# Patient Record
Sex: Female | Born: 1960 | Race: Black or African American | Hispanic: No | Marital: Single | Smoking: Never smoker
Health system: Southern US, Community
[De-identification: ages and names within clinical notes are randomized; demographics above are authoritative.]

## PROBLEM LIST (undated history)

## (undated) DIAGNOSIS — Z780 Asymptomatic menopausal state: Secondary | ICD-10-CM

## (undated) DIAGNOSIS — F419 Anxiety disorder, unspecified: Secondary | ICD-10-CM

## (undated) DIAGNOSIS — R0600 Dyspnea, unspecified: Secondary | ICD-10-CM

## (undated) DIAGNOSIS — R739 Hyperglycemia, unspecified: Secondary | ICD-10-CM

## (undated) DIAGNOSIS — I480 Paroxysmal atrial fibrillation: Secondary | ICD-10-CM

## (undated) DIAGNOSIS — G43909 Migraine, unspecified, not intractable, without status migrainosus: Secondary | ICD-10-CM

## (undated) DIAGNOSIS — R748 Abnormal levels of other serum enzymes: Secondary | ICD-10-CM

## (undated) DIAGNOSIS — F32A Depression, unspecified: Secondary | ICD-10-CM

## (undated) DIAGNOSIS — I10 Essential (primary) hypertension: Secondary | ICD-10-CM

## (undated) DIAGNOSIS — M1990 Unspecified osteoarthritis, unspecified site: Secondary | ICD-10-CM

## (undated) DIAGNOSIS — J309 Allergic rhinitis, unspecified: Secondary | ICD-10-CM

## (undated) HISTORY — PX: ABDOMINAL HYSTERECTOMY: SHX81

## (undated) HISTORY — DX: Asymptomatic menopausal state: Z78.0

## (undated) HISTORY — DX: Paroxysmal atrial fibrillation: I48.0

## (undated) HISTORY — DX: Essential (primary) hypertension: I10

## (undated) HISTORY — DX: Allergic rhinitis, unspecified: J30.9

## (undated) HISTORY — DX: Hyperglycemia, unspecified: R73.9

## (undated) HISTORY — DX: Migraine, unspecified, not intractable, without status migrainosus: G43.909

## (undated) HISTORY — DX: Dyspnea, unspecified: R06.00

## (undated) HISTORY — PX: NO PAST SURGERIES: SHX2092

## (undated) HISTORY — DX: Unspecified osteoarthritis, unspecified site: M19.90

## (undated) HISTORY — DX: Abnormal levels of other serum enzymes: R74.8

## (undated) HISTORY — DX: Anxiety disorder, unspecified: F41.9

## (undated) HISTORY — DX: Depression, unspecified: F32.A

---

## 2007-12-26 ENCOUNTER — Ambulatory Visit (HOSPITAL_COMMUNITY): Admission: RE | Admit: 2007-12-26 | Discharge: 2007-12-26 | Payer: Self-pay | Admitting: Internal Medicine

## 2020-07-13 ENCOUNTER — Encounter: Payer: Self-pay | Admitting: Cardiology

## 2020-07-13 DIAGNOSIS — I10 Essential (primary) hypertension: Secondary | ICD-10-CM | POA: Insufficient documentation

## 2020-07-13 DIAGNOSIS — R748 Abnormal levels of other serum enzymes: Secondary | ICD-10-CM

## 2020-07-13 DIAGNOSIS — F419 Anxiety disorder, unspecified: Secondary | ICD-10-CM | POA: Insufficient documentation

## 2020-07-13 DIAGNOSIS — M1990 Unspecified osteoarthritis, unspecified site: Secondary | ICD-10-CM | POA: Insufficient documentation

## 2020-07-13 DIAGNOSIS — F32A Depression, unspecified: Secondary | ICD-10-CM | POA: Insufficient documentation

## 2020-07-13 DIAGNOSIS — I480 Paroxysmal atrial fibrillation: Secondary | ICD-10-CM

## 2020-07-30 ENCOUNTER — Encounter: Payer: Self-pay | Admitting: Cardiology

## 2020-07-30 ENCOUNTER — Telehealth: Payer: Self-pay | Admitting: Cardiology

## 2020-07-30 ENCOUNTER — Ambulatory Visit (INDEPENDENT_AMBULATORY_CARE_PROVIDER_SITE_OTHER): Payer: Self-pay | Admitting: Cardiology

## 2020-07-30 ENCOUNTER — Other Ambulatory Visit: Payer: Self-pay

## 2020-07-30 VITALS — BP 150/98 | HR 73 | Ht 65.0 in | Wt 160.4 lb

## 2020-07-30 DIAGNOSIS — R0609 Other forms of dyspnea: Secondary | ICD-10-CM | POA: Insufficient documentation

## 2020-07-30 DIAGNOSIS — R0600 Dyspnea, unspecified: Secondary | ICD-10-CM

## 2020-07-30 DIAGNOSIS — I209 Angina pectoris, unspecified: Secondary | ICD-10-CM

## 2020-07-30 DIAGNOSIS — I10 Essential (primary) hypertension: Secondary | ICD-10-CM

## 2020-07-30 DIAGNOSIS — I480 Paroxysmal atrial fibrillation: Secondary | ICD-10-CM

## 2020-07-30 DIAGNOSIS — R748 Abnormal levels of other serum enzymes: Secondary | ICD-10-CM

## 2020-07-30 HISTORY — DX: Other forms of dyspnea: R06.09

## 2020-07-30 HISTORY — DX: Angina pectoris, unspecified: I20.9

## 2020-07-30 HISTORY — DX: Dyspnea, unspecified: R06.00

## 2020-07-30 MED ORDER — NITROGLYCERIN 0.4 MG SL SUBL: 0.4000 mg | SUBLINGUAL_TABLET | SUBLINGUAL | 6 refills | Status: DC | PRN

## 2020-07-30 NOTE — Progress Notes (Signed)
Cardiology Office Note:    Date:  07/30/2020   ID:  Joanna Griffin, DOB 1961/02/10, MRN [id number]  PCP:  Simone Curia, MD  Cardiologist:  Garwin Brothers, MD   Referring MD: Simone Curia, MD    ASSESSMENT:    1. Paroxysmal atrial fibrillation (HCC)   2. Angina pectoris (HCC)   3. DOE (dyspnea on exertion)   4. Abnormal liver enzymes   5. Primary hypertension    PLAN:    In order of problems listed above:  1. Angina pectoris: Chest tightness on exertion and dyspnea on exertion: This patient comes in standing and I discussed this with the patient at length.  In view of the following I will send her to [facility] now for blood work including D-dimer.  I am concerned whether there is any possibility of pulmonary embolism.  If this test is negative I will send her for coronary angiography.  I discussed invasive and noninvasive evaluation including CT coronary angiography with FFR.  She is not keen on any of those.  She wants coronary angiography because of the significant nature of her symptoms.  I respect her wishes.I discussed coronary angiography and left heart catheterization with the patient at extensive length. Procedure, benefits and potential risks were explained. Patient had multiple questions which were answered to the patient's satisfaction. Patient agreed and consented for the procedure. Further recommendations will be made based on the findings of the coronary angiography. In the interim. The patient has any significant symptoms he knows to go to the nearest emergency room.  She was also advised to take a coated baby aspirin on a daily basis 81 mg.  Sublingual nitroglycerin prescription was sent, its protocol and 911 protocol explained and the patient vocalized understanding questions were answered to the patient's satisfaction 2. Essential hypertension: Blood pressure stable and diet was emphasized. 3. History of paroxysmal atrial fibrillation: This is provided by the patient.   Medical management at this time. 4. Elevated LFTs: Managed by primary care provider. 5. Patient will be seen in follow-up appointment in 6 weeks or earlier if the patient has any concerns    Medication Adjustments/Labs and Tests Ordered: Current medicines are reviewed at length with the patient today.  Concerns regarding medicines are outlined above.  Orders Placed This Encounter  Procedures  . EKG 12-Lead   No orders of the defined types were placed in this encounter.    History of Present Illness:    Joanna Griffin is a 60 y.o. female who is being seen today for the evaluation of dyspnea on exertion and chest tightness at the request of Simone Curia, MD.  Patient is a pleasant 60 year old female.  She has past medical history of essential hypertension and paroxysmal atrial fibrillation.  She mentions to me that she was told to have atrial fibrillation but occipital in nature and [facility] many years ago.  Patient is referred here for shortness of breath on exertion.  She tells me that she walks a few steps and get out of breath.  This has been of concern to her.  This is been happening for the past several weeks.  No orthopnea or PND.  She does not exercise on a regular basis.  She is not sexually active.  She also has chest tightness with the symptoms.  At the time of my evaluation, the patient is alert awake oriented and in no distress.  Past Medical History:  Diagnosis Date  . Abnormal liver enzymes   .  Allergic rhinitis   . Anxiety   . Depression   . Dyspnea   . Hyperglycemia   . Hypertension, benign   . Menopause   . Migraine   . Osteoarthritis   . Paroxysmal atrial fibrillation [facility])     Past Surgical History:  Procedure Laterality Date  . NO PAST SURGERIES      Current Medications: Current Meds  Medication Sig  . ALPRAZolam (XANAX) 0.25 MG tablet Take 0.25 mg by mouth every 8 (eight) hours as needed for anxiety.  Marland Kitchen aspirin EC 81 MG tablet Take 81 mg by mouth  daily. Swallow whole.  . estradiol (ESTRACE) 2 MG tablet Take 2 mg by mouth daily.  Marland Kitchen ibuprofen (ADVIL) 200 MG tablet Take 200 mg by mouth daily as needed for mild pain.  Marland Kitchen loratadine (CLARITIN) 10 MG tablet Take 10 mg by mouth daily.  . meloxicam (MOBIC) 15 MG tablet Take 15 mg by mouth daily as needed for pain.  . metoprolol succinate (TOPROL-XL) 50 MG 24 hr tablet Take 50 mg by mouth daily. Take with or immediately following a meal.  . SUMAtriptan (IMITREX) 100 MG tablet Take 100 mg by mouth every 2 (two) hours as needed for migraine. May repeat in 2 hours if headache persists or recurs.     Allergies:   Codeine   Social History   Socioeconomic History  . Marital status: Single    Spouse name: Not on file  . Number of children: Not on file  . Years of education: Not on file  . Highest education level: Not on file  Occupational History  . Not on file  Tobacco Use  . Smoking status: Never Smoker  . Smokeless tobacco: Never Used  Substance and Sexual Activity  . Alcohol use: Never  . Drug use: Never  . Sexual activity: Not on file  Other Topics Concern  . Not on file  Social History Narrative  . Not on file   Social Determinants of Health   Financial Resource Strain: Not on file  Food Insecurity: Not on file  Transportation Needs: Not on file  Physical Activity: Not on file  Stress: Not on file  Social Connections: Not on file     Family History: The patient's family history includes Hypertension in her father and mother.  ROS:   Please see the history of present illness.    All other systems reviewed and are negative.  EKGs/Labs/Other Studies Reviewed:    The following studies were reviewed today: EKG reveals sinus rhythm and nonspecific ST-T changes.   Recent Labs: No results found for requested labs within last 8760 hours.  Recent Lipid Panel No results found for: CHOL, TRIG, HDL, CHOLHDL, VLDL, LDLCALC, LDLDIRECT  Physical Exam:    VS:  BP (!) 150/98    Pulse 73   Ht 5\' 5"  (1.651 m)   Wt 160 lb 6.4 oz (72.8 kg)   SpO2 99%   BMI 26.69 kg/m     Wt Readings from Last 3 Encounters:  07/30/20 160 lb 6.4 oz (72.8 kg)  07/07/20 155 lb (70.3 kg)     GEN: Patient is in no acute distress HEENT: Normal NECK: No JVD; No carotid bruits LYMPHATICS: No lymphadenopathy CARDIAC: S1 S2 regular, 2/6 systolic murmur at the apex. RESPIRATORY:  Clear to auscultation without rales, wheezing or rhonchi  ABDOMEN: Soft, non-tender, non-distended MUSCULOSKELETAL:  No edema; No deformity  SKIN: Warm and dry NEUROLOGIC:  Alert and oriented x 3 PSYCHIATRIC:  Normal  affect    Signed, Garwin Brothers, MD  07/30/2020 2:09 PM    [facility]

## 2020-07-30 NOTE — Patient Instructions (Signed)
Medication Instructions:  Your physician has recommended you make the following change in your medication:   Take 81 mg coated aspirin daily. Use nitroglycerin as needed for chest pain.  *If you need a refill on your cardiac medications before your next appointment, please call your pharmacy*   Lab Work: Your physician recommends that you have a BMET and CBC at [facility].  If you have labs (blood work) drawn today and your tests are completely normal, you will receive your results only by: Marland Kitchen MyChart Message (if you have MyChart) OR . A paper copy in the mail If you have any lab test that is abnormal or we need to change your treatment, we will call you to review the results.   Testing/Procedures:    [facility] CARDIOVASCULAR DIVISION [facility] [place] [address] [place] [address] Dept: [phone] Loc: [phone]  SONTEE DESENA  07/30/2020  You are scheduled for a Cardiac Catheterization on Monday, April 11 with Dr. Nicki Guadalajara.  1. Please arrive at the [facility] (Main Entrance A) at [facility]: [address] [place], [place] [address] at 7:00 AM (This time is two hours before your procedure to ensure your preparation). Free valet parking service is available.   Special note: Every effort is made to have your procedure done on time. Please understand that emergencies sometimes delay scheduled procedures.  2. Diet: Do not eat solid foods after midnight.  The patient may have clear liquids until 5am upon the day of the procedure.  3. Labs: Your labs were done at [facility].  4. Medication instructions in preparation for your procedure:   Contrast Allergy: No  Stop taking, Advil or Motrin (Ibuprofen), Mobic (Meloxicam) Monday, April 10,  On the morning of your procedure, take your Aspirin and any morning medicines NOT listed above.  You may use sips of water.  5. Plan for one night stay--bring  personal belongings. 6. Bring a current list of your medications and current insurance cards. 7. You MUST have a responsible person to drive you home. 8. Someone MUST be with you the first 24 hours after you arrive home or your discharge will be delayed. 9. Please wear clothes that are easy to get on and off and wear slip-on shoes.  Thank you for allowing [place] to care for you!   -- [facility]  Your physician has requested that you have an echocardiogram. Echocardiography is a painless test that uses sound waves to create images of your heart. It provides your doctor with information about the size and shape of your heart and how well your heart's chambers and valves are working. This procedure takes approximately one hour. There are no restrictions for this procedure.    Follow-Up: At [facility], you and your health needs are our priority.  As part of our continuing mission to provide you with exceptional heart care, we have created designated Provider Care Teams.  These Care Teams include your primary Cardiologist (physician) and Advanced Practice Providers (APPs -  Physician Assistants and Nurse Practitioners) who all work together to provide you with the care you need, when you need it.  We recommend signing up for the patient portal called "MyChart".  Sign up information is provided on this After Visit Summary.  MyChart is used to connect with patients for Virtual Visits (Telemedicine).  Patients are able to view lab/test results, encounter notes, upcoming appointments, etc.  Non-urgent messages can  be sent to your provider as well.   To learn more about what you can do with MyChart, go to [email or web address].    Your next appointment:   1 month(s)  The format for your next appointment:   In Person  Provider:   Belva Cromeajan Revankar, MD   Other Instructions  Coronary Angiogram With Stent Coronary angiogram with stent placement is a procedure to  widen or open a narrow blood vessel of the heart (coronary artery). Arteries may become blocked by cholesterol buildup (plaques) in the lining of the artery wall. When a coronary artery becomes partially blocked, blood flow to that area decreases. This may lead to chest pain or a heart attack (myocardial infarction). A stent is a small piece of metal that looks like mesh or spring. Stent placement may be done as treatment after a heart attack, or to prevent a heart attack if a blocked artery is found by a coronary angiogram. Let your health care provider know about:  Any allergies you have, including allergies to medicines or contrast dye.  All medicines you are taking, including vitamins, herbs, eye drops, creams, and over-the-counter medicines.  Any problems you or family members have had with anesthetic medicines.  Any blood disorders you have.  Any surgeries you have had.  Any medical conditions you have, including kidney problems or kidney failure.  Whether you are pregnant or may be pregnant.  Whether you are breastfeeding. What are the risks? Generally, this is a safe procedure. However, serious problems may occur, including: 1. Damage to nearby structures or organs, such as the heart, blood vessels, or kidneys. 2. A return of blockage. 3. Bleeding, infection, or bruising at the insertion site. 4. A collection of blood under the skin (hematoma) at the insertion site. 5. A blood clot in another part of the body. 6. Allergic reaction to medicines or dyes. 7. Bleeding into the abdomen (retroperitoneal bleeding). 8. Stroke (rare). 9. Heart attack (rare). What happens before the procedure? Staying hydrated Follow instructions from your health care provider about hydration, which may include: 1. Up to 2 hours before the procedure - you may continue to drink clear liquids, such as water, clear fruit juice, black coffee, and plain tea.    Eating and drinking restrictions Follow  instructions from your health care provider about eating and drinking, which may include: 1. 8 hours before the procedure - stop eating heavy meals or foods, such as meat, fried foods, or fatty foods. 2. 6 hours before the procedure - stop eating light meals or foods, such as toast or cereal. 3. 2 hours before the procedure - stop drinking clear liquids. Medicines Ask your health care provider about: 1. Changing or stopping your regular medicines. This is especially important if you are taking diabetes medicines or blood thinners. 2. Taking medicines such as aspirin and ibuprofen. These medicines can thin your blood. Do not take these medicines unless your health care provider tells you to take them. ? Generally, aspirin is recommended before a thin tube, called a catheter, is passed through a blood vessel and inserted into the heart (cardiac catheterization). 3. Taking over-the-counter medicines, vitamins, herbs, and supplements. General instructions 1. Do not use any products that contain nicotine or tobacco for at least 4 weeks before the procedure. These products include cigarettes, e-cigarettes, and chewing tobacco. If you need help quitting, ask your health care provider. 2. Plan to have someone take you home from the hospital or clinic. 3. If you will be going  home right after the procedure, plan to have someone with you for 24 hours. 4. You may have tests and imaging procedures. 5. Ask your health care provider: 1. How your insertion site will be marked. Ask which artery will be used for the procedure. 2. What steps will be taken to help prevent infection. These may include:  Removing hair at the insertion site.  Washing skin with a germ-killing soap.  Taking antibiotic medicine. What happens during the procedure? 1. An IV will be inserted into one of your veins. 2. Electrodes may be placed on your chest to monitor your heart rate during the procedure. 3. You will be given one or  more of the following: ? A medicine to help you relax (sedative). ? A medicine to numb the area (local anesthetic) for catheter insertion. 4. A small incision will be made for catheter insertion. 5. The catheter will be inserted into an artery using a guide wire. The location may be in your groin, your wrist, or the fold of your arm (near your elbow). 6. An X-ray procedure (fluoroscopy) will be used to help guide the catheter to the opening of the heart arteries. 7. A dye will be injected into the catheter. X-rays will be taken. The dye helps to show where any narrowing or blockages are located in the arteries. 8. Tell your health care provider if you have chest pain or trouble breathing. 9. A tiny wire will be guided to the blocked spot, and a balloon will be inflated to make the artery wider. 10. The stent will be expanded to crush the plaques into the wall of the vessel. The stent will hold the area open and improve the blood flow. Most stents have a drug coating to reduce the risk of the stent narrowing over time. 11. The artery may be made wider using a drill, laser, or other tools that remove plaques. 12. The catheter will be removed when the blood flow improves. The stent will stay where it was placed, and the lining of the artery will grow over it. 13. A bandage (dressing) will be placed on the insertion site. Pressure will be applied to stop bleeding. 14. The IV will be removed. This procedure may vary among health care providers and hospitals.    What happens after the procedure?  Your blood pressure, heart rate, breathing rate, and blood oxygen level will be monitored until you leave the hospital or clinic.  If the procedure is done through the leg, you will lie flat in bed for a few hours or for as long as told by your health care provider. You will be instructed not to bend or cross your legs.  The insertion site and the pulse in your foot or wrist will be checked often.  You may  have more blood tests, X-rays, and a test that records the electrical activity of your heart (electrocardiogram, or ECG).  Do not drive for 24 hours if you were given a sedative during your procedure. Summary  Coronary angiogram with stent placement is a procedure to widen or open a narrowed coronary artery. This is done to treat heart problems.  Before the procedure, let your health care provider know about all the medical conditions and surgeries you have or have had.  This is a safe procedure. However, some problems may occur, including damage to nearby structures or organs, bleeding, blood clots, or allergies.  Follow your health care provider's instructions about eating, drinking, medicines, and other lifestyle changes,  such as quitting tobacco use before the procedure. This information is not intended to replace advice given to you by your health care provider. Make sure you discuss any questions you have with your health care provider. Document Revised: 10/31/2018 Document Reviewed: 10/31/2018 Elsevier Patient Education  2021 Elsevier Inc.  Aspirin and Your Heart Aspirin is a medicine that prevents the platelets in your blood from sticking together. Platelets are the cells that your blood uses for clotting. Aspirin can be used to help reduce the risk of blood clots, heart attacks, and other heart-related problems. What are the risks? Daily use of aspirin can cause side effects. Some of these include:  Bleeding. Bleeding can be minor or serious. An example of minor bleeding is bleeding from a cut, and the bleeding does not stop. An example of more serious bleeding is stomach bleeding or, rarely, bleeding into the brain. Your risk of bleeding increases if you are also taking NSAIDs, such as ibuprofen.  Increased bruising.  Upset stomach.  An allergic reaction. People who have growths inside the nose (nasal polyps) have an increased risk of developing an aspirin allergy. How to use  aspirin to care for your heart 10. Take aspirin only as told by your health care provider. Make sure that you understand how much to take and what form to take. The two forms of aspirin are: 1. Non-enteric-coated.This type of aspirin does not have a coating and is absorbed quickly. This type of aspirin also comes in a chewable form. 2. Enteric-coated. This type of aspirin has a coating that releases the medicine very slowly. Enteric-coated aspirin might cause less stomach upset than non-enteric-coated aspirin. This type of aspirin should not be chewed or crushed. 11. Work with your health care provider to find out whether it is safe and beneficial for you to take aspirin daily. Taking aspirin daily may be helpful if: 1. You have had a heart attack or chest pain, or you are at risk for a heart attack. 2. You have a condition in which certain heart vessels are blocked (coronary artery disease), and you have had a procedure to treat it. Examples are:  Open-heart surgery, such as coronary artery bypass surgery (CABG).  Coronary angioplasty,which is done to widen a blood vessel of your heart.  Having a small mesh tube, or stent, placed in your coronary artery. 3. You have had certain types of stroke or a mini-stroke known as a transient ischemic attack (TIA). 4. You have a narrowing of the arteries that supply the limbs (peripheral artery disease, or PAD). 5. You have long-term (chronic) heart rhythm problems, such as atrial fibrillation, and your health care provider thinks aspirin may help. 6. You have valve disease or have had surgery on a valve. 7. You are considered at increased risk of developing coronary artery disease or PAD.    Follow these instructions at home Medicines 2. Take over-the-counter and prescription medicines only as told by your health care provider. 3. If you are taking blood thinners: ? Talk with your health care provider before you take any medicines that contain aspirin  or NSAIDs, such as ibuprofen. These medicines increase your risk for dangerous bleeding. ? Take your medicine exactly as told, at the same time every day. ? Avoid activities that could cause injury or bruising, and follow instructions about how to prevent falls. ? Wear a medical alert bracelet or carry a card that lists what medicines you take. General instructions 4. Do not drink alcohol if: 1.  Your health care provider tells you not to drink. 2. You are pregnant, may be pregnant, or are planning to become pregnant. 5. If you drink alcohol: 1. Limit how much you use to:  0-1 drink a day for women.  0-2 drinks a day for men. 2. Be aware of how much alcohol is in your drink. In the [place] one drink equals one 12 oz bottle of beer (355 mL), one 5 oz glass of wine (148 mL), or one 1 oz glass of hard liquor (44 mL). 6. Keep all follow-up visits as told by your health care provider. This is important. Where to find more information 4. The American Heart Association: www.heart.org Contact a health care provider if you have: 6. Unusual bleeding or bruising. 7. Stomach pain or nausea. 8. Ringing in your ears. 9. An allergic reaction that causes hives, itchy skin, or swelling of the lips, tongue, or face. Get help right away if: 15. You notice that your bowel movements are bloody, or dark red or black in color. 16. You vomit or cough up blood. 17. You have blood in your urine. 18. You cough, breathe loudly (wheeze), or feel short of breath. 19. You have chest pain, especially if the pain spreads to your arms, back, neck, or jaw. 20. You have a headache with confusion. You have any symptoms of a stroke. "BE FAST" is an easy way to remember the main warning signs of a stroke:  B - Balance. Signs are dizziness, sudden trouble walking, or loss of balance.  E - Eyes. Signs are trouble seeing or a sudden change in vision.  F - Face. Signs are sudden weakness or numbness of the face, or the face  or eyelid drooping on one side.  A - Arms. Signs are weakness or numbness in an arm. This happens suddenly and usually on one side of the body.  S - Speech. Signs are sudden trouble speaking, slurred speech, or trouble understanding what people say.  T - Time. Time to call emergency services. Write down what time symptoms started. You have other signs of a stroke, such as:  A sudden, severe headache with no known cause.  Nausea or vomiting.  Seizure. These symptoms may represent a serious problem that is an emergency. Do not wait to see if the symptoms will go away. Get medical help right away. Call your local emergency services (911 in the [place] Do not drive yourself to the hospital. Summary  Aspirin use can help reduce the risk of blood clots, heart attacks, and other heart-related problems.  Daily use of aspirin can cause side effects.  Take aspirin only as told by your health care provider. Make sure that you understand how much to take and what form to take.  Your health care provider will help you determine whether it is safe and beneficial for you to take aspirin daily. This information is not intended to replace advice given to you by your health care provider. Make sure you discuss any questions you have with your health care provider. Document Revised: 01/14/2019 Document Reviewed: 01/14/2019 Elsevier Patient Education  2021 Elsevier Inc. Nitroglycerin sublingual tablets What is this medicine? NITROGLYCERIN (nye troe GLI ser in) is a type of vasodilator. It relaxes blood vessels, increasing the blood and oxygen supply to your heart. This medicine is used to relieve chest pain caused by angina. It is also used to prevent chest pain before activities like climbing stairs, going outdoors in cold weather, or sexual activity.  This medicine may be used for other purposes; ask your health care provider or pharmacist if you have questions. COMMON BRAND NAME(S): Nitroquick,  Nitrostat, Nitrotab What should I tell my health care provider before I take this medicine? They need to know if you have any of these conditions:  anemia  head injury, recent stroke, or bleeding in the brain  liver disease  previous heart attack  an unusual or allergic reaction to nitroglycerin, other medicines, foods, dyes, or preservatives  pregnant or trying to get pregnant  breast-feeding How should I use this medicine? Take this medicine by mouth as needed. Use at the first sign of an angina attack (chest pain or tightness). You can also take this medicine 5 to 10 minutes before an event likely to produce chest pain. Follow the directions exactly as written on the prescription label. Place one tablet under your tongue and let it dissolve. Do not swallow whole. Replace the dose if you accidentally swallow it. It will help if your mouth is not dry. Saliva around the tablet will help it to dissolve more quickly. Do not eat or drink, smoke or chew tobacco while a tablet is dissolving. Sit down when taking this medicine. In an angina attack, you should feel better within 5 minutes after your first dose. You can take a dose every 5 minutes up to a total of 3 doses. If you do not feel better or feel worse after 1 dose, call 9-1-1 at once. Do not take more than 3 doses in 15 minutes. Your health care provider might give you other directions. Follow those directions if he or she does. Do not take your medicine more often than directed. Talk to your health care provider about the use of this medicine in children. Special care may be needed. Overdosage: If you think you have taken too much of this medicine contact a poison control center or emergency room at once. NOTE: This medicine is only for you. Do not share this medicine with others. What if I miss a dose? This does not apply. This medicine is only used as needed. What may interact with this medicine? Do not take this medicine with any of  the following medications: 12. certain migraine medicines like ergotamine and dihydroergotamine (DHE) 13. medicines used to treat erectile dysfunction like sildenafil, tadalafil, and vardenafil 14. riociguat This medicine may also interact with the following medications: 4. alteplase 5. aspirin 6. heparin 7. medicines for high blood pressure 8. medicines for mental depression 9. other medicines used to treat angina 10. phenothiazines like chlorpromazine, mesoridazine, prochlorperazine, thioridazine This list may not describe all possible interactions. Give your health care provider a list of all the medicines, herbs, non-prescription drugs, or dietary supplements you use. Also tell them if you smoke, drink alcohol, or use illegal drugs. Some items may interact with your medicine. What should I watch for while using this medicine? Tell your doctor or health care professional if you feel your medicine is no longer working. Keep this medicine with you at all times. Sit or lie down when you take your medicine to prevent falling if you feel dizzy or faint after using it. Try to remain calm. This will help you to feel better faster. If you feel dizzy, take several deep breaths and lie down with your feet propped up, or bend forward with your head resting between your knees. You may get drowsy or dizzy. Do not drive, use machinery, or do anything that needs mental alertness until  you know how this drug affects you. Do not stand or sit up quickly, especially if you are an older patient. This reduces the risk of dizzy or fainting spells. Alcohol can make you more drowsy and dizzy. Avoid alcoholic drinks. Do not treat yourself for coughs, colds, or pain while you are taking this medicine without asking your doctor or health care professional for advice. Some ingredients may increase your blood pressure. What side effects may I notice from receiving this medicine? Side effects that you should report to your  doctor or health care professional as soon as possible: 7. allergic reactions (skin rash, itching or hives; swelling of the face, lips, or tongue) 8. low blood pressure (dizziness; feeling faint or lightheaded, falls; unusually weak or tired) 9. low red blood cell counts (trouble breathing; feeling faint; lightheaded, falls; unusually weak or tired) Side effects that usually do not require medical attention (report to your doctor or health care professional if they continue or are bothersome): 5. facial flushing (redness) 6. headache 7. nausea, vomiting This list may not describe all possible side effects. Call your doctor for medical advice about side effects. You may report side effects to FDA at 1-800-FDA-1088. Where should I keep my medicine? Keep out of the reach of children. Store at room temperature between 20 and 25 degrees C (68 and 77 degrees F). Store in Retail buyer. Protect from light and moisture. Keep tightly closed. Throw away any unused medicine after the expiration date. NOTE: This sheet is a summary. It may not cover all possible information. If you have questions about this medicine, talk to your doctor, pharmacist, or health care provider.  2021 Elsevier/Gold Standard (2018-01-10 16:46:32)   Echocardiogram An echocardiogram is a test that uses sound waves (ultrasound) to produce images of the heart. Images from an echocardiogram can provide important information about:  Heart size and shape.  The size and thickness and movement of your heart's walls.  Heart muscle function and strength.  Heart valve function or if you have stenosis. Stenosis is when the heart valves are too narrow.  If blood is flowing backward through the heart valves (regurgitation).  A tumor or infectious growth around the heart valves.  Areas of heart muscle that are not working well because of poor blood flow or injury from a heart attack.  Aneurysm detection. An aneurysm is a weak  or damaged part of an artery wall. The wall bulges out from the normal force of blood pumping through the body. Tell a health care provider about:  Any allergies you have.  All medicines you are taking, including vitamins, herbs, eye drops, creams, and over-the-counter medicines.  Any blood disorders you have.  Any surgeries you have had.  Any medical conditions you have.  Whether you are pregnant or may be pregnant. What are the risks? Generally, this is a safe test. However, problems may occur, including an allergic reaction to dye (contrast) that may be used during the test. What happens before the test? No specific preparation is needed. You may eat and drink normally. What happens during the test?  You will take off your clothes from the waist up and put on a hospital gown.  Electrodes or electrocardiogram (ECG)patches may be placed on your chest. The electrodes or patches are then connected to a device that monitors your heart rate and rhythm.  You will lie down on a table for an ultrasound exam. A gel will be applied to your chest to help sound waves  pass through your skin.  A handheld device, called a transducer, will be pressed against your chest and moved over your heart. The transducer produces sound waves that travel to your heart and bounce back (or "echo" back) to the transducer. These sound waves will be captured in real-time and changed into images of your heart that can be viewed on a video monitor. The images will be recorded on a computer and reviewed by your health care provider.  You may be asked to change positions or hold your breath for a short time. This makes it easier to get different views or better views of your heart.  In some cases, you may receive contrast through an IV in one of your veins. This can improve the quality of the pictures from your heart. The procedure may vary among health care providers and hospitals.   What can I expect after the  test? You may return to your normal, everyday life, including diet, activities, and medicines, unless your health care provider tells you not to do that. Follow these instructions at home:  It is up to you to get the results of your test. Ask your health care provider, or the department that is doing the test, when your results will be ready.  Keep all follow-up visits. This is important. Summary  An echocardiogram is a test that uses sound waves (ultrasound) to produce images of the heart.  Images from an echocardiogram can provide important information about the size and shape of your heart, heart muscle function, heart valve function, and other possible heart problems.  You do not need to do anything to prepare before this test. You may eat and drink normally.  After the echocardiogram is completed, you may return to your normal, everyday life, unless your health care provider tells you not to do that. This information is not intended to replace advice given to you by your health care provider. Make sure you discuss any questions you have with your health care provider. Document Revised: 12/03/2019 Document Reviewed: 12/03/2019 Elsevier Patient Education  2021 [employer].

## 2020-07-30 NOTE — Telephone Encounter (Signed)
Lisa with [facility] states the patient just showed up at the hospital with an order given to her from DR. Revankar. Misty Stanley states the order is missing the ICD 10 code. Can someone call and provide this to her soon?  I am not able to access it.  Phone #: [phone] (ext#: [phone])

## 2020-07-30 NOTE — Telephone Encounter (Signed)
ICD 10 code provided

## 2020-07-31 ENCOUNTER — Other Ambulatory Visit (HOSPITAL_COMMUNITY)
Admission: RE | Admit: 2020-07-31 | Discharge: 2020-07-31 | Disposition: A | Discharge status: Home / Self Care | Payer: PRIVATE HEALTH INSURANCE | Source: Ambulatory Visit | Attending: Cardiovascular Disease | Admitting: Cardiovascular Disease

## 2020-07-31 ENCOUNTER — Other Ambulatory Visit: Payer: Self-pay

## 2020-07-31 ENCOUNTER — Telehealth: Payer: Self-pay

## 2020-07-31 DIAGNOSIS — Z20822 Contact with and (suspected) exposure to covid-19: Secondary | ICD-10-CM | POA: Insufficient documentation

## 2020-07-31 DIAGNOSIS — Z01812 Encounter for preprocedural laboratory examination: Secondary | ICD-10-CM | POA: Insufficient documentation

## 2020-07-31 DIAGNOSIS — R079 Chest pain, unspecified: Secondary | ICD-10-CM

## 2020-07-31 MED ORDER — FUROSEMIDE 40 MG PO TABS: 40.0000 mg | ORAL_TABLET | Freq: Every day | ORAL | 0 refills | Status: DC

## 2020-07-31 NOTE — Telephone Encounter (Signed)
Patients CTA report from [facility] was reviewed by Dr. Servando Salina. No pulmonary embolism noted. Trace right pleural effusion and mild edema was present. Per Dr. Mallory Shirk verbal instructions I sent in 40 mgs of furosemide for the patient to take for the next two days. Patient verbalizes understanding.    Encouraged patient to call back with any questions or concerns.

## 2020-07-31 NOTE — Addendum Note (Signed)
Addended by: Eleonore Chiquito on: 07/31/2020 10:18 AM   Modules accepted: Orders

## 2020-07-31 NOTE — Telephone Encounter (Signed)
Called pt and advised her that she needed to have a stat chest CT to R/O PE after having a positive D-Dimer. Pt states where do I need to do that, I gave her the option of [facility] or [facility]. Pt then states that I have a lot going on today and don't think I really need it. I advised pt that she could have a blood clot in her lung and without a CT for confirmation and if she has a blood clot in her lung that goes untreated she could die. Pt states well I understand but I have a lot going on today and I might call you back if I decide. I again stated that if she has a blood clot in her lung and it goes untreated she could die. Pt verbalized understanding and had no additional questions.

## 2020-07-31 NOTE — Telephone Encounter (Signed)
Appointment for stat CT 07/31/20 at 2:15. Pt is aware and verbalized understanding.

## 2020-07-31 NOTE — Telephone Encounter (Signed)
I called her and let her know and she said she was waiting for your records to get an appointment or something like that I called her at her home number and she also gave me her cell phone number [phone]

## 2020-08-01 LAB — SARS CORONAVIRUS 2 (TAT 6-24 HRS): SARS Coronavirus 2: NEGATIVE

## 2020-08-03 ENCOUNTER — Encounter (HOSPITAL_COMMUNITY)
Admission: RE | Disposition: A | Discharge status: Home / Self Care | Payer: Self-pay | Source: Ambulatory Visit | Attending: Cardiovascular Disease

## 2020-08-03 ENCOUNTER — Ambulatory Visit (HOSPITAL_COMMUNITY)
Admission: RE | Admit: 2020-08-03 | Discharge: 2020-08-03 | Disposition: A | Discharge status: Home / Self Care | Payer: Self-pay | Source: Ambulatory Visit | Attending: Cardiovascular Disease | Admitting: Cardiovascular Disease

## 2020-08-03 ENCOUNTER — Other Ambulatory Visit: Payer: Self-pay

## 2020-08-03 ENCOUNTER — Encounter (HOSPITAL_COMMUNITY): Payer: Self-pay | Admitting: Cardiovascular Disease

## 2020-08-03 DIAGNOSIS — R079 Chest pain, unspecified: Secondary | ICD-10-CM

## 2020-08-03 DIAGNOSIS — R0600 Dyspnea, unspecified: Secondary | ICD-10-CM

## 2020-08-03 DIAGNOSIS — R748 Abnormal levels of other serum enzymes: Secondary | ICD-10-CM

## 2020-08-03 DIAGNOSIS — I480 Paroxysmal atrial fibrillation: Secondary | ICD-10-CM

## 2020-08-03 DIAGNOSIS — R0609 Other forms of dyspnea: Secondary | ICD-10-CM

## 2020-08-03 DIAGNOSIS — I10 Essential (primary) hypertension: Secondary | ICD-10-CM | POA: Insufficient documentation

## 2020-08-03 DIAGNOSIS — I209 Angina pectoris, unspecified: Secondary | ICD-10-CM

## 2020-08-03 HISTORY — PX: LEFT HEART CATH AND CORONARY ANGIOGRAPHY: CATH118249

## 2020-08-03 SURGERY — LEFT HEART CATH AND CORONARY ANGIOGRAPHY
Anesthesia: LOCAL

## 2020-08-03 MED ORDER — FENTANYL CITRATE (PF) 100 MCG/2ML IJ SOLN
INTRAMUSCULAR | Status: DC | PRN
  Administered 2020-08-03: 50 ug via INTRAVENOUS

## 2020-08-03 MED ORDER — SODIUM CHLORIDE 0.9% FLUSH: 3.0000 mL | Freq: Two times a day (BID) | INTRAVENOUS | Status: DC

## 2020-08-03 MED ORDER — LABETALOL HCL 5 MG/ML IV SOLN: 10.0000 mg | INTRAVENOUS | Status: DC | PRN

## 2020-08-03 MED ORDER — FENTANYL CITRATE (PF) 100 MCG/2ML IJ SOLN
INTRAMUSCULAR | Status: AC
  Filled 2020-08-03: qty 2

## 2020-08-03 MED ORDER — SODIUM CHLORIDE 0.9 % WEIGHT BASED INFUSION: 1.0000 mL/kg/h | INTRAVENOUS | Status: DC

## 2020-08-03 MED ORDER — HYDRALAZINE HCL 20 MG/ML IJ SOLN
INTRAMUSCULAR | Status: DC | PRN
  Administered 2020-08-03: 10 mg via INTRAVENOUS

## 2020-08-03 MED ORDER — HEPARIN SODIUM (PORCINE) 1000 UNIT/ML IJ SOLN
INTRAMUSCULAR | Status: DC | PRN
  Administered 2020-08-03: 3500 [IU] via INTRAVENOUS

## 2020-08-03 MED ORDER — VERAPAMIL HCL 2.5 MG/ML IV SOLN
INTRAVENOUS | Status: AC
  Filled 2020-08-03: qty 2

## 2020-08-03 MED ORDER — HEPARIN (PORCINE) IN NACL 1000-0.9 UT/500ML-% IV SOLN
INTRAVENOUS | Status: AC
  Filled 2020-08-03: qty 500

## 2020-08-03 MED ORDER — ONDANSETRON HCL 4 MG/2ML IJ SOLN: 4.0000 mg | Freq: Four times a day (QID) | INTRAMUSCULAR | Status: DC | PRN

## 2020-08-03 MED ORDER — ACETAMINOPHEN 325 MG PO TABS: 650.0000 mg | ORAL_TABLET | ORAL | Status: DC | PRN

## 2020-08-03 MED ORDER — MIDAZOLAM HCL 2 MG/2ML IJ SOLN
INTRAMUSCULAR | Status: AC
  Filled 2020-08-03: qty 2

## 2020-08-03 MED ORDER — SODIUM CHLORIDE 0.9 % WEIGHT BASED INFUSION
3.0000 mL/kg/h | INTRAVENOUS | Status: AC
  Administered 2020-08-03: 3 mL/kg/h via INTRAVENOUS

## 2020-08-03 MED ORDER — LIDOCAINE HCL (PF) 1 % IJ SOLN
INTRAMUSCULAR | Status: AC
  Filled 2020-08-03: qty 30

## 2020-08-03 MED ORDER — SODIUM CHLORIDE 0.9 % IV SOLN: 250.0000 mL | INTRAVENOUS | Status: DC | PRN

## 2020-08-03 MED ORDER — HYDRALAZINE HCL 20 MG/ML IJ SOLN: 10.0000 mg | INTRAMUSCULAR | Status: DC | PRN

## 2020-08-03 MED ORDER — VERAPAMIL HCL 2.5 MG/ML IV SOLN
INTRAVENOUS | Status: DC | PRN
  Administered 2020-08-03: 10 mL via INTRA_ARTERIAL

## 2020-08-03 MED ORDER — ASPIRIN 81 MG PO CHEW
81.0000 mg | CHEWABLE_TABLET | ORAL | Status: AC
  Administered 2020-08-03: 81 mg via ORAL
  Filled 2020-08-03: qty 1

## 2020-08-03 MED ORDER — HYDRALAZINE HCL 20 MG/ML IJ SOLN
INTRAMUSCULAR | Status: AC
  Filled 2020-08-03: qty 1

## 2020-08-03 MED ORDER — DIAZEPAM 5 MG PO TABS: 5.0000 mg | ORAL_TABLET | Freq: Four times a day (QID) | ORAL | Status: DC | PRN

## 2020-08-03 MED ORDER — MIDAZOLAM HCL 2 MG/2ML IJ SOLN
INTRAMUSCULAR | Status: DC | PRN
  Administered 2020-08-03: 2 mg via INTRAVENOUS

## 2020-08-03 MED ORDER — HEPARIN (PORCINE) IN NACL 1000-0.9 UT/500ML-% IV SOLN
INTRAVENOUS | Status: DC | PRN
  Administered 2020-08-03 (×2): 500 mL

## 2020-08-03 MED ORDER — SODIUM CHLORIDE 0.9 % IV SOLN: INTRAVENOUS | Status: DC

## 2020-08-03 MED ORDER — HEPARIN SODIUM (PORCINE) 1000 UNIT/ML IJ SOLN
INTRAMUSCULAR | Status: AC
  Filled 2020-08-03: qty 1

## 2020-08-03 MED ORDER — SODIUM CHLORIDE 0.9% FLUSH: 3.0000 mL | INTRAVENOUS | Status: DC | PRN

## 2020-08-03 MED ORDER — LIDOCAINE HCL (PF) 1 % IJ SOLN
INTRAMUSCULAR | Status: DC | PRN
  Administered 2020-08-03: 2 mL via INTRADERMAL

## 2020-08-03 MED ORDER — IOHEXOL 350 MG/ML SOLN
INTRAVENOUS | Status: DC | PRN
  Administered 2020-08-03: 50 mL

## 2020-08-03 SURGICAL SUPPLY — 11 items
CATH INFINITI 5FR ANG PIGTAIL (CATHETERS) ×1 IMPLANT
CATH OPTITORQUE TIG 4.0 5F (CATHETERS) ×1 IMPLANT
DEVICE RAD TR BAND REGULAR (VASCULAR PRODUCTS) ×1 IMPLANT
GLIDESHEATH SLEND SS 6F .021 (SHEATH) ×1 IMPLANT
GUIDEWIRE INQWIRE 1.5J.035X260 (WIRE) IMPLANT
INQWIRE 1.5J .035X260CM (WIRE) ×2
KIT HEART LEFT (KITS) ×2 IMPLANT
PACK CARDIAC CATHETERIZATION (CUSTOM PROCEDURE TRAY) ×2 IMPLANT
SHEATH PROBE COVER 6X72 (BAG) ×1 IMPLANT
TRANSDUCER W/STOPCOCK (MISCELLANEOUS) ×2 IMPLANT
TUBING CIL FLEX 10 FLL-RA (TUBING) ×2 IMPLANT

## 2020-08-03 NOTE — Discharge Instructions (Signed)
DRINK PLENTY OF FLUIDS OVER THE NEXT 2-3 DAYS.  Radial Site Care  This sheet gives you information about how to care for yourself after your procedure. Your health care provider may also give you more specific instructions. If you have problems or questions, contact your health care provider. What can I expect after the procedure? After the procedure, it is common to have:  Bruising and tenderness at the catheter insertion area. Follow these instructions at home: Medicines  Take over-the-counter and prescription medicines only as told by your health care provider. Insertion site care  Follow instructions from your health care provider about how to take care of your insertion site. Make sure you: ? Wash your hands with soap and water before you change your bandage (dressing). If soap and water are not available, use hand sanitizer. ? Change your dressing as told by your health care provider. ? Leave stitches (sutures), skin glue, or adhesive strips in place. These skin closures may need to stay in place for 2 weeks or longer. If adhesive strip edges start to loosen and curl up, you may trim the loose edges. Do not remove adhesive strips completely unless your health care provider tells you to do that.  Check your insertion site every day for signs of infection. Check for: ? Redness, swelling, or pain. ? Fluid or blood. ? Pus or a bad smell. ? Warmth.  Do not take baths, swim, or use a hot tub until your health care provider approves.  You may shower 24-48 hours after the procedure, or as directed by your health care provider. ? Remove the dressing and gently wash the site with plain soap and water. ? Pat the area dry with a clean towel. ? Do not rub the site. That could cause bleeding.  Do not apply powder or lotion to the site. Activity  For 24 hours after the procedure, or as directed by your health care provider: ? Do not flex or bend the affected arm. ? Do not push or pull  heavy objects with the affected arm. ? Do not drive yourself home from the hospital or clinic. You may drive 24 hours after the procedure unless your health care provider tells you not to. ? Do not operate machinery or power tools.  Do not lift anything that is heavier than 10 lb (4.5 kg), or the limit that you are told, until your health care provider says that it is safe.  Ask your health care provider when it is okay to: ? Return to work or school. ? Resume usual physical activities or sports. ? Resume sexual activity.   General instructions  If the catheter site starts to bleed, raise your arm and put firm pressure on the site. If the bleeding does not stop, get help right away. This is a medical emergency.  If you went home on the same day as your procedure, a responsible adult should be with you for the first 24 hours after you arrive home.  Keep all follow-up visits as told by your health care provider. This is important. Contact a health care provider if:  You have a fever.  You have redness, swelling, or yellow drainage around your insertion site. Get help right away if:  You have unusual pain at the radial site.  The catheter insertion area swells very fast.  The insertion area is bleeding, and the bleeding does not stop when you hold steady pressure on the area.  Your arm or hand becomes pale,   cool, tingly, or numb. These symptoms may represent a serious problem that is an emergency. Do not wait to see if the symptoms will go away. Get medical help right away. Call your local emergency services (911 in the [place] Do not drive yourself to the hospital. Summary  After the procedure, it is common to have bruising and tenderness at the site.  Follow instructions from your health care provider about how to take care of your radial site wound. Check the wound every day for signs of infection.  Do not lift anything that is heavier than 10 lb (4.5 kg), or the limit that you  are told, until your health care provider says that it is safe. This information is not intended to replace advice given to you by your health care provider. Make sure you discuss any questions you have with your health care provider. Document Revised: 05/17/2017 Document Reviewed: 05/17/2017 Elsevier Patient Education  2021 [employer]vier Inc.  

## 2020-08-03 NOTE — Progress Notes (Signed)
Unable to obtain 2nd Iv.

## 2020-08-10 ENCOUNTER — Telehealth: Payer: Self-pay

## 2020-08-10 NOTE — Telephone Encounter (Signed)
Spoke to the patient just now and got her scheduled for a follow up visit with Dr. Tomie China. I also advised her that Dr. Dulce Sellar would like for her to come to the office for a nurse visit so that we can check her vital signs and pulses. To this she states "I am not going to do that as I can check all of those things at home". I advised that if she changed her mind and would like to come in to please let us know.    Encouraged patient to call back with any questions or concerns.

## 2020-08-24 ENCOUNTER — Other Ambulatory Visit: Payer: Self-pay

## 2020-08-24 ENCOUNTER — Ambulatory Visit (HOSPITAL_COMMUNITY): Payer: PRIVATE HEALTH INSURANCE | Attending: Cardiovascular Disease

## 2020-08-24 DIAGNOSIS — R0600 Dyspnea, unspecified: Secondary | ICD-10-CM | POA: Insufficient documentation

## 2020-08-24 DIAGNOSIS — R0609 Other forms of dyspnea: Secondary | ICD-10-CM

## 2020-08-24 LAB — ECHOCARDIOGRAM COMPLETE
Area-P 1/2: 3.66 cm2
S' Lateral: 1.8 cm

## 2020-08-25 ENCOUNTER — Telehealth: Payer: Self-pay | Admitting: Cardiovascular Disease

## 2020-08-25 NOTE — Telephone Encounter (Signed)
Informed pt that once ECHO results were reviewed by MD, her nurse would call with results. Pt verbalized understanding.

## 2020-08-25 NOTE — Telephone Encounter (Signed)
This is a Dr Henrietta Hoover patient, will forward to his RN Onnie Boer

## 2020-08-25 NOTE — Telephone Encounter (Signed)
PT is calling to find out her results of her ECHO.Please advise

## 2020-08-26 ENCOUNTER — Telehealth: Payer: Self-pay | Admitting: Cardiology

## 2020-08-26 DIAGNOSIS — I10 Essential (primary) hypertension: Secondary | ICD-10-CM

## 2020-08-26 DIAGNOSIS — I480 Paroxysmal atrial fibrillation: Secondary | ICD-10-CM

## 2020-08-26 DIAGNOSIS — R079 Chest pain, unspecified: Secondary | ICD-10-CM

## 2020-08-26 MED ORDER — NITROGLYCERIN 0.4 MG SL SUBL: 0.4000 mg | SUBLINGUAL_TABLET | SUBLINGUAL | 3 refills | Status: DC | PRN

## 2020-08-26 MED ORDER — METOPROLOL SUCCINATE ER 50 MG PO TB24: 50.0000 mg | ORAL_TABLET | Freq: Every day | ORAL | 3 refills | Status: DC

## 2020-08-26 NOTE — Telephone Encounter (Signed)
Pt came in today inquiring about the results of her latest echo and to ask if we had sent in her scripts for nitro and metoprolol to [facility] on [address]. Please advise  [phone]  Thank you!

## 2020-08-27 NOTE — Telephone Encounter (Signed)
Spoke with patient regarding results and recommendation.  Patient verbalizes understanding and is agreeable to plan of care. Advised patient to call back with any issues or concerns.   You are scheduled for Cardiac MRI on ______. Please arrive at the [facility] main entrance of [facility] at _____ (30-45 minutes prior to test start time). ?   [facility]  [address]  [place], [place] [address]  [phone]  Proceed to the [facility] Radiology Department (First Floor).  ?  Magnetic resonance imaging (MRI) is a painless test that produces images of the inside of the body without using X-rays. During an MRI, strong magnets and radio waves work together in a Data processing manager to form detailed images. MRI images may provide more details about a medical condition than X-rays, CT scans, and ultrasounds can provide.   You may be given earphones to listen for instructions.   You may eat a light breakfast and take medications as ordered with the exception of furosemide.   If a contrast material will be used, an IV will be inserted into one of your veins. Contrast material will be injected into your IV.   You will be asked to remove all metal, including: Watch, jewelry, and other metal objects including hearing aids, hair pieces and dentures. (Braces and fillings normally are not a problem.)   If contrast material was used:   It will leave your body through your urine within a day. You may be told to drink plenty of fluids to help flush the contrast material out of your system.   TEST WILL TAKE APPROXIMATELY 1 HOUR  PLEASE NOTIFY SCHEDULING AT LEAST 24 HOURS IN ADVANCE IF YOU ARE UNABLE TO KEEP YOUR APPOINTMENT.

## 2020-08-28 ENCOUNTER — Telehealth: Payer: Self-pay

## 2020-08-28 ENCOUNTER — Other Ambulatory Visit: Payer: Self-pay

## 2020-08-28 DIAGNOSIS — G43909 Migraine, unspecified, not intractable, without status migrainosus: Secondary | ICD-10-CM | POA: Insufficient documentation

## 2020-08-28 DIAGNOSIS — J309 Allergic rhinitis, unspecified: Secondary | ICD-10-CM | POA: Insufficient documentation

## 2020-08-28 DIAGNOSIS — R739 Hyperglycemia, unspecified: Secondary | ICD-10-CM | POA: Insufficient documentation

## 2020-08-28 DIAGNOSIS — Z780 Asymptomatic menopausal state: Secondary | ICD-10-CM | POA: Insufficient documentation

## 2020-08-28 DIAGNOSIS — I10 Essential (primary) hypertension: Secondary | ICD-10-CM | POA: Insufficient documentation

## 2020-08-28 DIAGNOSIS — R0600 Dyspnea, unspecified: Secondary | ICD-10-CM | POA: Insufficient documentation

## 2020-08-28 LAB — HEPATIC FUNCTION PANEL
ALT: 23 IU/L (ref 0–32)
AST: 44 IU/L — ABNORMAL HIGH (ref 0–40)
Albumin: 3.4 g/dL — ABNORMAL LOW (ref 3.8–4.9)
Alkaline Phosphatase: 74 IU/L (ref 44–121)
Bilirubin Total: 1.9 mg/dL — ABNORMAL HIGH (ref 0.0–1.2)
Bilirubin, Direct: 1.2 mg/dL — ABNORMAL HIGH (ref 0.00–0.40)
Total Protein: 6.3 g/dL (ref 6.0–8.5)

## 2020-08-28 LAB — CBC WITH DIFFERENTIAL/PLATELET
Basophils Absolute: 0 10*3/uL (ref 0.0–0.2)
Basos: 0 %
EOS (ABSOLUTE): 0 10*3/uL (ref 0.0–0.4)
Eos: 1 %
Hematocrit: 43.6 % (ref 34.0–46.6)
Hemoglobin: 15 g/dL (ref 11.1–15.9)
Immature Grans (Abs): 0 10*3/uL (ref 0.0–0.1)
Immature Granulocytes: 0 %
Lymphocytes Absolute: 2.4 10*3/uL (ref 0.7–3.1)
Lymphs: 38 %
MCH: 34.9 pg — ABNORMAL HIGH (ref 26.6–33.0)
MCHC: 34.4 g/dL (ref 31.5–35.7)
MCV: 101 fL — ABNORMAL HIGH (ref 79–97)
Monocytes Absolute: 0.9 10*3/uL (ref 0.1–0.9)
Monocytes: 15 %
Neutrophils Absolute: 2.9 10*3/uL (ref 1.4–7.0)
Neutrophils: 46 %
Platelets: 104 10*3/uL — ABNORMAL LOW (ref 150–450)
RBC: 4.3 x10E6/uL (ref 3.77–5.28)
RDW: 11.9 % (ref 11.7–15.4)
WBC: 6.3 10*3/uL (ref 3.4–10.8)

## 2020-08-28 LAB — BASIC METABOLIC PANEL
BUN/Creatinine Ratio: 13 (ref 9–23)
BUN: 12 mg/dL (ref 6–24)
CO2: 24 mmol/L (ref 20–29)
Calcium: 8.6 mg/dL — ABNORMAL LOW (ref 8.7–10.2)
Chloride: 104 mmol/L (ref 96–106)
Creatinine, Ser: 0.96 mg/dL (ref 0.57–1.00)
Glucose: 136 mg/dL — ABNORMAL HIGH (ref 65–99)
Potassium: 3.7 mmol/L (ref 3.5–5.2)
Sodium: 142 mmol/L (ref 134–144)
eGFR: 69 mL/min/{1.73_m2} (ref >59)

## 2020-08-28 LAB — TSH: TSH: 0.911 u[IU]/mL (ref 0.450–4.500)

## 2020-08-28 LAB — D-DIMER, QUANTITATIVE: D-DIMER: 2.23 mg/L FEU — ABNORMAL HIGH (ref 0.00–0.49)

## 2020-08-28 NOTE — Telephone Encounter (Signed)
Left message on patients voicemail to please return our call.   

## 2020-08-28 NOTE — Telephone Encounter (Signed)
-----   Message from Rajan R Revankar, MD sent at 08/28/2020  8:35 AM EDT ----- D-dimer is elevated.  Needs CT of chest with contrast to rule out pulmonary embolism.  Copy to primary care physician.  Other lab abnormalities to be addressed by primary care. Rajan R Revankar, MD 08/28/2020 8:35 AM  

## 2020-08-28 NOTE — Telephone Encounter (Signed)
-----   Message from Garwin Brothers, MD sent at 08/28/2020  8:35 AM EDT ----- D-dimer is elevated.  Needs CT of chest with contrast to rule out pulmonary embolism.  Copy to primary care physician.  Other lab abnormalities to be addressed by primary care. Garwin Brothers, MD 08/28/2020 8:35 AM

## 2020-09-07 ENCOUNTER — Ambulatory Visit: Payer: Self-pay | Admitting: Cardiology

## 2020-09-10 DIAGNOSIS — I110 Hypertensive heart disease with heart failure: Secondary | ICD-10-CM

## 2020-09-10 DIAGNOSIS — I5032 Chronic diastolic (congestive) heart failure: Secondary | ICD-10-CM

## 2020-09-11 ENCOUNTER — Telehealth: Payer: Self-pay | Admitting: Cardiology

## 2020-09-11 ENCOUNTER — Telehealth (HOSPITAL_COMMUNITY): Payer: Self-pay | Admitting: *Deleted

## 2020-09-11 DIAGNOSIS — I5032 Chronic diastolic (congestive) heart failure: Secondary | ICD-10-CM | POA: Diagnosis not present

## 2020-09-11 DIAGNOSIS — I110 Hypertensive heart disease with heart failure: Secondary | ICD-10-CM | POA: Diagnosis not present

## 2020-09-11 NOTE — Telephone Encounter (Signed)
Attempted to call patient regarding upcoming cardiac MRI appointment. Left message on voicemail with name and callback number  Arthi Mcdonald RN Navigator Cardiac Imaging [facility] [phone] Office [phone] Cell  

## 2020-09-11 NOTE — Telephone Encounter (Signed)
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  Hope this helps. [place], RN  [phone]

## 2020-09-11 NOTE — Telephone Encounter (Signed)
New message    Nurse navagator at [facility] is calling to see why pt is having cardiac MRI morphology.  She said there is no documentation in patient's chart for this test except chest pain and the reading doctor is going to want more info.  Please call.  Test is scheduled for Monday.

## 2020-09-14 ENCOUNTER — Ambulatory Visit (HOSPITAL_COMMUNITY)
Admission: RE | Admit: 2020-09-14 | Discharge: 2020-09-14 | Disposition: A | Discharge status: Home / Self Care | Payer: PRIVATE HEALTH INSURANCE | Source: Ambulatory Visit | Attending: Cardiology | Admitting: Cardiology

## 2020-09-14 ENCOUNTER — Other Ambulatory Visit: Payer: Self-pay

## 2020-09-14 DIAGNOSIS — R079 Chest pain, unspecified: Secondary | ICD-10-CM | POA: Insufficient documentation

## 2020-09-14 MED ORDER — GADOBUTROL 1 MMOL/ML IV SOLN
7.0000 mL | Freq: Once | INTRAVENOUS | Status: AC | PRN
  Administered 2020-09-14: 7 mL via INTRAVENOUS

## 2020-09-15 ENCOUNTER — Other Ambulatory Visit: Payer: Self-pay

## 2020-09-24 ENCOUNTER — Telehealth: Payer: Self-pay | Admitting: Cardiology

## 2020-09-24 NOTE — Telephone Encounter (Signed)
Pt c/o of Chest Pain: 1. Are you having CP right now? Yes  2. Are you experiencing any other symptoms (ex. SOB, nausea, vomiting, sweating)? Nausea and vomiting 3. How long have you been experiencing CP? About 5 days  4. Is your CP continuous or coming and going? coming and going hits her at night  5. Have you taken Nitroglycerin? yes

## 2020-09-24 NOTE — Telephone Encounter (Signed)
Pt advised to go to the ED for evaluation of chest tightness that radiates into her back. Pt states she has n/v, shob and is taking several nitroglycerin. Pt verbalized understanding and had no additional questions.

## 2020-09-24 NOTE — Telephone Encounter (Signed)
Tried to call patient back no answer and voicemail box not set up.

## 2020-10-02 ENCOUNTER — Encounter: Payer: Self-pay | Admitting: Cardiology

## 2020-10-02 ENCOUNTER — Other Ambulatory Visit: Payer: Self-pay

## 2020-10-02 ENCOUNTER — Ambulatory Visit (INDEPENDENT_AMBULATORY_CARE_PROVIDER_SITE_OTHER): Payer: PRIVATE HEALTH INSURANCE | Admitting: Cardiology

## 2020-10-02 VITALS — BP 85/58 | HR 64 | Ht 65.0 in | Wt 142.8 lb

## 2020-10-02 DIAGNOSIS — I10 Essential (primary) hypertension: Secondary | ICD-10-CM | POA: Diagnosis not present

## 2020-10-02 DIAGNOSIS — R0600 Dyspnea, unspecified: Secondary | ICD-10-CM | POA: Diagnosis not present

## 2020-10-02 DIAGNOSIS — I272 Pulmonary hypertension, unspecified: Secondary | ICD-10-CM | POA: Insufficient documentation

## 2020-10-02 DIAGNOSIS — I480 Paroxysmal atrial fibrillation: Secondary | ICD-10-CM | POA: Diagnosis not present

## 2020-10-02 DIAGNOSIS — R0609 Other forms of dyspnea: Secondary | ICD-10-CM

## 2020-10-02 MED ORDER — METOPROLOL SUCCINATE ER 25 MG PO TB24: 50.0000 mg | ORAL_TABLET | Freq: Every day | ORAL | 3 refills | Status: AC

## 2020-10-02 MED ORDER — FUROSEMIDE 20 MG PO TABS: 40.0000 mg | ORAL_TABLET | Freq: Every day | ORAL | 3 refills | Status: DC

## 2020-10-02 NOTE — Addendum Note (Signed)
Addended by: Eleonore Chiquito on: 10/02/2020 01:18 PM   Modules accepted: Orders

## 2020-10-02 NOTE — Progress Notes (Signed)
Cardiology Office Note:    Date:  10/02/2020   ID:  Joanna Griffin, DOB 1960-04-28, MRN [id number]  PCP:  Simone Curia, MD  Cardiologist:  Garwin Brothers, MD   Referring MD: Simone Curia, MD    ASSESSMENT:    1. Paroxysmal atrial fibrillation (HCC)   2. DOE (dyspnea on exertion)   3. Severe pulmonary hypertension (HCC)   4. Hypertension, benign    PLAN:    In order of problems listed above:  Congestive heart failure: Preserved left ventricular systolic function.  Right-sided failure with biatrial severe enlargement.  Patient was advised to continue current medications.  In view of fatigue and borderline blood pressure I told her to cut out her beta-blocker and furosemide to half dose and she is agreeable.  She will have a Chem-7 today.  Hospital records were discussed extensively with the patient.  She will have a Chem-7 today as mentioned earlier.  Diet and salt intake issues were discussed. Essential hypertension: Blood pressure stable and on the lower side so we will cut back on her medications as mentioned above. Paroxysmal atrial fibrillation:I discussed with the patient atrial fibrillation, disease process. Management and therapy including rate and rhythm control, anticoagulation benefits and potential risks were discussed extensively with the patient. Patient had multiple questions which were answered to patient's satisfaction. I reviewed echocardiogram and MRI report and discussed with her that I would send her to our structural heart disease colleagues for evaluation of pulmonary hypertension and right-sided cardiac pathology. Patient will be seen in follow-up appointment in 3 months or earlier if the patient has any concerns    Medication Adjustments/Labs and Tests Ordered: Current medicines are reviewed at length with the patient today.  Concerns regarding medicines are outlined above.  No orders of the defined types were placed in this encounter.  No orders of the  defined types were placed in this encounter.    No chief complaint on file.    History of Present Illness:    Joanna Griffin is a 60 y.o. female.  Patient has past medical history of congestive heart failure and was recently in the hospital.  I reviewed her records extensively.  She was treated with diuretics and feels well.  Now she feels a little tired and her blood pressure is borderline.  Her echocardiogram and MRI are significantly abnormal with right-sided dilatation and reduced right-sided systolic function and elevated pulmonary artery pressures.  Patient is doing better now too weak and feels tired easily.  At the time of my evaluation, the patient is alert awake oriented and in no distress.  Past Medical History:  Diagnosis Date   Abnormal liver enzymes    Allergic rhinitis    Angina pectoris (HCC) 07/30/2020   Anxiety    Depression    DOE (dyspnea on exertion) 07/30/2020   Dyspnea    Hyperglycemia    Hypertension    Hypertension, benign    Menopause    Migraine    Osteoarthritis    Paroxysmal atrial fibrillation [facility])     Past Surgical History:  Procedure Laterality Date   LEFT HEART CATH AND CORONARY ANGIOGRAPHY N/A 08/03/2020   Procedure: LEFT HEART CATH AND CORONARY ANGIOGRAPHY;  Surgeon: Lennette Bihari, MD;  Location: [facility] INVASIVE CV LAB;  Service: Cardiovascular;  Laterality: N/A;   NO PAST SURGERIES      Current Medications: Current Meds  Medication Sig   ALPRAZolam (XANAX) 0.25 MG tablet Take 0.25 mg by mouth every 8 (  eight) hours as needed for anxiety.   aspirin EC 81 MG tablet Take 81 mg by mouth daily. Swallow whole.   dapagliflozin propanediol (FARXIGA) 10 MG TABS tablet Take 10 mg by mouth daily.   estradiol (ESTRACE) 2 MG tablet Take 2 mg by mouth daily.   furosemide (LASIX) 40 MG tablet Take 1 tablet (40 mg total) by mouth daily.   ibuprofen (ADVIL) 200 MG tablet Take 200 mg by mouth daily as needed for mild pain.   loratadine (CLARITIN) 10 MG tablet  Take 10 mg by mouth daily.   meloxicam (MOBIC) 15 MG tablet Take 15 mg by mouth daily as needed for pain.   metoprolol succinate (TOPROL-XL) 50 MG 24 hr tablet Take 1 tablet (50 mg total) by mouth daily. Take with or immediately following a meal.   nitroGLYCERIN (NITROSTAT) 0.4 MG SL tablet Place 0.4 mg under the tongue every 5 (five) minutes as needed for chest pain.   potassium chloride (KLOR-CON) 10 MEQ tablet Take 10 mEq by mouth 2 (two) times daily.   spironolactone (ALDACTONE) 25 MG tablet Take 25 mg by mouth daily.   SUMAtriptan (IMITREX) 100 MG tablet Take 100 mg by mouth every 2 (two) hours as needed for migraine. May repeat in 2 hours if headache persists or recurs.     Allergies:   Codeine   Social History   Socioeconomic History   Marital status: Single    Spouse name: Not on file   Number of children: Not on file   Years of education: Not on file   Highest education level: Not on file  Occupational History   Not on file  Tobacco Use   Smoking status: Never   Smokeless tobacco: Never  Substance and Sexual Activity   Alcohol use: Never   Drug use: Never   Sexual activity: Not on file  Other Topics Concern   Not on file  Social History Narrative   Not on file   Social Determinants of Health   Financial Resource Strain: Not on file  Food Insecurity: Not on file  Transportation Needs: Not on file  Physical Activity: Not on file  Stress: Not on file  Social Connections: Not on file     Family History: The patient's family history includes Hypertension in her father and mother.  ROS:   Please see the history of present illness.    All other systems reviewed and are negative.  EKGs/Labs/Other Studies Reviewed:    The following studies were reviewed today: EKG reveals sinus rhythm and nonspecific ST-T changes and has been done in the past   Recent Labs: 08/27/2020: ALT 23; BUN 12; Creatinine, Ser 0.96; Hemoglobin 15.0; Platelets 104; Potassium 3.7; Sodium  142; TSH 0.911  Recent Lipid Panel No results found for: CHOL, TRIG, HDL, CHOLHDL, VLDL, LDLCALC, LDLDIRECT  Physical Exam:    VS:  BP (!) 85/58   Pulse 64   Ht 5\' 5"  (1.651 m)   Wt 142 lb 12.8 oz (64.8 kg)   SpO2 98%   BMI 23.76 kg/m     Wt Readings from Last 3 Encounters:  10/02/20 142 lb 12.8 oz (64.8 kg)  08/03/20 155 lb (70.3 kg)  07/30/20 160 lb 6.4 oz (72.8 kg)     GEN: Patient is in no acute distress HEENT: Normal NECK: No JVD; No carotid bruits LYMPHATICS: No lymphadenopathy CARDIAC: Hear sounds regular, 2/6 systolic murmur at the apex. RESPIRATORY:  Clear to auscultation without rales, wheezing or rhonchi  ABDOMEN:  Soft, non-tender, non-distended MUSCULOSKELETAL:  No edema; No deformity  SKIN: Warm and dry NEUROLOGIC:  Alert and oriented x 3 PSYCHIATRIC:  Normal affect   Signed, Garwin Brothers, MD  10/02/2020 1:08 PM    [facility]

## 2020-10-02 NOTE — Patient Instructions (Signed)
Medication Instructions:  Your physician has recommended you make the following change in your medication:   Decrease your furosemide to 20 mg daily. Take 1/2 tablet of your 40 mg dose. Decrease your metoprolol succinate to 25 mg daily. Take 1/2 tablet of your 50 mg dose.  *If you need a refill on your cardiac medications before your next appointment, please call your pharmacy*   Lab Work: Your physician recommends that you have a BMET done today in the office.   If you have labs (blood work) drawn today and your tests are completely normal, you will receive your results only by: MyChart Message (if you have MyChart) OR A paper copy in the mail If you have any lab test that is abnormal or we need to change your treatment, we will call you to review the results.   Testing/Procedures: None ordered   Follow-Up: At [facility], you and your health needs are our priority.  As part of our continuing mission to provide you with exceptional heart care, we have created designated Provider Care Teams.  These Care Teams include your primary Cardiologist (physician) and Advanced Practice Providers (APPs -  Physician Assistants and Nurse Practitioners) who all work together to provide you with the care you need, when you need it.  We recommend signing up for the patient portal called "MyChart".  Sign up information is provided on this After Visit Summary.  MyChart is used to connect with patients for Virtual Visits (Telemedicine).  Patients are able to view lab/test results, encounter notes, upcoming appointments, etc.  Non-urgent messages can be sent to your provider as well.   To learn more about what you can do with MyChart, go to [email or web address].    Your next appointment:   3 month(s)  The format for your next appointment:   In Person  Provider:   Belva Crome, MD   Other Instructions NA

## 2020-10-03 LAB — BASIC METABOLIC PANEL
BUN/Creatinine Ratio: 16 (ref 9–23)
BUN: 21 mg/dL (ref 6–24)
CO2: 21 mmol/L (ref 20–29)
Calcium: 9.7 mg/dL (ref 8.7–10.2)
Chloride: 102 mmol/L (ref 96–106)
Creatinine, Ser: 1.29 mg/dL — ABNORMAL HIGH (ref 0.57–1.00)
Glucose: 101 mg/dL — ABNORMAL HIGH (ref 65–99)
Potassium: 5 mmol/L (ref 3.5–5.2)
Sodium: 139 mmol/L (ref 134–144)
eGFR: 48 mL/min/{1.73_m2} — ABNORMAL LOW (ref >59)

## 2020-10-05 NOTE — Addendum Note (Signed)
Addended by: Eleonore Chiquito on: 10/05/2020 10:52 AM   Modules accepted: Orders

## 2020-10-06 ENCOUNTER — Telehealth: Payer: Self-pay | Admitting: Cardiology

## 2020-10-06 NOTE — Telephone Encounter (Signed)
New message:       Pt would like to switch from Dr Tomie China to Dr Servando Salina service please. Is this alright with  you both?

## 2020-10-14 ENCOUNTER — Telehealth: Payer: Self-pay | Admitting: Cardiology

## 2020-10-14 NOTE — Telephone Encounter (Signed)
New Message:     Pt wants Dr Tomie China to know that Dr Gala Romney can not see her  until September. She says she can not wait that long. She is till having chest pain  and tightness in her chest. Also pain in her back.

## 2020-10-14 NOTE — Telephone Encounter (Signed)
Called patient informed her that I reached out to nurse with structural team to see if appt can be expedited. Also she requested provider switch so I copied Dr. Servando Salina and her RN on this. She is aware Dr. Tomie China agrees for her to be seen in the emergency department if having chest pain. Advised her to reach back out to [place] if she doesn't hear anything about getting specialty appt scheduled sooner. No further questions.

## 2020-10-16 ENCOUNTER — Other Ambulatory Visit: Payer: Self-pay | Admitting: Nurse Practitioner

## 2020-10-16 DIAGNOSIS — B182 Chronic viral hepatitis C: Secondary | ICD-10-CM

## 2020-10-16 NOTE — Telephone Encounter (Signed)
Message sent to schedulers to arrange appt with Dr Gala Romney next week

## 2020-10-20 ENCOUNTER — Encounter (HOSPITAL_COMMUNITY): Payer: Self-pay | Admitting: Internal Medicine

## 2020-10-20 ENCOUNTER — Other Ambulatory Visit: Payer: Self-pay

## 2020-10-20 ENCOUNTER — Ambulatory Visit (HOSPITAL_COMMUNITY)
Admission: RE | Admit: 2020-10-20 | Discharge: 2020-10-20 | Disposition: A | Discharge status: Home / Self Care | Payer: PRIVATE HEALTH INSURANCE | Source: Ambulatory Visit | Attending: Internal Medicine | Admitting: Internal Medicine

## 2020-10-20 ENCOUNTER — Other Ambulatory Visit (HOSPITAL_COMMUNITY): Payer: Self-pay | Admitting: Internal Medicine

## 2020-10-20 ENCOUNTER — Other Ambulatory Visit (HOSPITAL_COMMUNITY): Payer: Self-pay | Admitting: *Deleted

## 2020-10-20 VITALS — BP 100/60 | HR 66 | Wt 138.2 lb

## 2020-10-20 DIAGNOSIS — Z7982 Long term (current) use of aspirin: Secondary | ICD-10-CM | POA: Insufficient documentation

## 2020-10-20 DIAGNOSIS — I272 Pulmonary hypertension, unspecified: Secondary | ICD-10-CM

## 2020-10-20 DIAGNOSIS — Z8616 Personal history of COVID-19: Secondary | ICD-10-CM | POA: Insufficient documentation

## 2020-10-20 DIAGNOSIS — R002 Palpitations: Secondary | ICD-10-CM

## 2020-10-20 DIAGNOSIS — Z7984 Long term (current) use of oral hypoglycemic drugs: Secondary | ICD-10-CM | POA: Diagnosis not present

## 2020-10-20 DIAGNOSIS — R748 Abnormal levels of other serum enzymes: Secondary | ICD-10-CM | POA: Diagnosis not present

## 2020-10-20 DIAGNOSIS — I119 Hypertensive heart disease without heart failure: Secondary | ICD-10-CM | POA: Insufficient documentation

## 2020-10-20 DIAGNOSIS — K7460 Unspecified cirrhosis of liver: Secondary | ICD-10-CM | POA: Diagnosis not present

## 2020-10-20 DIAGNOSIS — I480 Paroxysmal atrial fibrillation: Secondary | ICD-10-CM | POA: Insufficient documentation

## 2020-10-20 DIAGNOSIS — Z833 Family history of diabetes mellitus: Secondary | ICD-10-CM | POA: Diagnosis not present

## 2020-10-20 DIAGNOSIS — G4719 Other hypersomnia: Secondary | ICD-10-CM | POA: Diagnosis not present

## 2020-10-20 DIAGNOSIS — Z8249 Family history of ischemic heart disease and other diseases of the circulatory system: Secondary | ICD-10-CM | POA: Insufficient documentation

## 2020-10-20 DIAGNOSIS — I10 Essential (primary) hypertension: Secondary | ICD-10-CM

## 2020-10-20 DIAGNOSIS — R0683 Snoring: Secondary | ICD-10-CM | POA: Diagnosis not present

## 2020-10-20 DIAGNOSIS — Z79899 Other long term (current) drug therapy: Secondary | ICD-10-CM | POA: Insufficient documentation

## 2020-10-20 LAB — COMPREHENSIVE METABOLIC PANEL
ALT: 34 U/L (ref 0–44)
AST: 43 U/L — ABNORMAL HIGH (ref 15–41)
Albumin: 3.7 g/dL (ref 3.5–5.0)
Alkaline Phosphatase: 45 U/L (ref 38–126)
Anion gap: 11 (ref 5–15)
BUN: 18 mg/dL (ref 6–20)
CO2: 27 mmol/L (ref 22–32)
Calcium: 10.3 mg/dL (ref 8.9–10.3)
Chloride: 98 mmol/L (ref 98–111)
Creatinine, Ser: 1.13 mg/dL — ABNORMAL HIGH (ref 0.44–1.00)
GFR, Estimated: 57 mL/min — ABNORMAL LOW (ref >60)
Glucose, Bld: 103 mg/dL — ABNORMAL HIGH (ref 70–99)
Potassium: 4.5 mmol/L (ref 3.5–5.1)
Sodium: 136 mmol/L (ref 135–145)
Total Bilirubin: 1.5 mg/dL — ABNORMAL HIGH (ref 0.3–1.2)
Total Protein: 9.1 g/dL — ABNORMAL HIGH (ref 6.5–8.1)

## 2020-10-20 LAB — CBC
HCT: 60.3 % — ABNORMAL HIGH (ref 36.0–46.0)
Hemoglobin: 19.9 g/dL — ABNORMAL HIGH (ref 12.0–15.0)
MCH: 33.9 pg (ref 26.0–34.0)
MCHC: 33 g/dL (ref 30.0–36.0)
MCV: 102.7 fL — ABNORMAL HIGH (ref 80.0–100.0)
Platelets: 207 10*3/uL (ref 150–400)
RBC: 5.87 MIL/uL — ABNORMAL HIGH (ref 3.87–5.11)
RDW: 14.1 % (ref 11.5–15.5)
WBC: 11.2 10*3/uL — ABNORMAL HIGH (ref 4.0–10.5)
nRBC: 0 % (ref 0.0–0.2)

## 2020-10-20 LAB — TSH: TSH: 1.088 u[IU]/mL (ref 0.350–4.500)

## 2020-10-20 MED ORDER — SPIRONOLACTONE 25 MG PO TABS: 12.5000 mg | ORAL_TABLET | Freq: Every day | ORAL | Status: AC

## 2020-10-20 MED ORDER — SODIUM CHLORIDE 0.9% FLUSH: 3.0000 mL | Freq: Two times a day (BID) | INTRAVENOUS | Status: DC

## 2020-10-20 NOTE — Patient Instructions (Addendum)
Stop Aspirin  Decrease Spironolactone to 12.5 mg (1/2 tab) Daily  Labs done today, we will call you for abnormal results, other wise no news is good news  Your provider has recommended that  you wear a Zio Patch for 14 days.  This monitor will record your heart rhythm for our review.  IF you have any symptoms while wearing the monitor please press the button.  If you have any issues with the patch or you notice a red or orange light on it please call the company at [phone].  Once you remove the patch please mail it back to the company as soon as possible so we can get the results.  Your provider has recommended that you have a home sleep study.  We have provided you with the equipment in our office today. DO NOT COMPLETE THE TEST UNTIL WE CALL AND ADVISE YOU TO DO SO, this to make sure insurance will cover the test. Once approved please download the app and follow the instructions. YOUR PIN NUMBER IS: [id number]. Once you have completed the test you just dispose of the equipment, the information is automatically uploaded to [place] via blue-tooth technology. If your test is positive for sleep apnea and you need a home CPAP machine you will be contacted by Dr Norris Cross office [facility]) to set this up.  Heart Catheterization on Friday 10/23/20, see instructions below  If you have any questions or concerns before your next appointment please send [place] a message through [employer] or call our office at [phone].    TO LEAVE A MESSAGE FOR THE NURSE SELECT OPTION 2, PLEASE LEAVE A MESSAGE INCLUDING: YOUR NAME DATE OF BIRTH CALL BACK NUMBER REASON FOR CALL**this is important as we prioritize the call backs  YOU WILL RECEIVE A CALL BACK THE SAME DAY AS LONG AS YOU CALL BEFORE 4:00 PM  At the [facility], you and your health needs are our priority. As part of our continuing mission to provide you with exceptional heart care, we have created designated Provider Care Teams. These Care  Teams include your primary Cardiologist (physician) and Advanced Practice Providers (APPs- Physician Assistants and Nurse Practitioners) who all work together to provide you with the care you need, when you need it.   You may see any of the following providers on your designated Care Team at your next follow up: Dr Arvilla Meres Dr Marca Ancona Dr Brandon Melnick, NP Robbie Lis, [place] Mikki Santee Karle Plumber, PharmD   Please be sure to bring in all your medications bottles to every appointment.    Heart Catheterization Instructions:  You are scheduled for a Cardiac Catheterization on Friday, July 1 with Dr. Arvilla Meres.  1. Please arrive at the [facility] (Main Entrance A) at [facility]: [address] [place], [place] [address] at 5:30 AM (This time is two hours before your procedure to ensure your preparation). Free valet parking service is available.   Special note: Every effort is made to have your procedure done on time. Please understand that emergencies sometimes delay scheduled procedures.  2. Diet: Do not eat solid foods after midnight.  The patient may have clear liquids until 5am upon the day of the procedure.  3. Labs: DONE TODAY  4. Medication instructions in preparation for your procedure:   Contrast Allergy: No  On the morning of your procedure, take YOUR morning medicines NOT listed above.  You may use sips of water.  5. Plan  for one night stay--bring personal belongings. 6. Bring a current list of your medications and current insurance cards. 7. You MUST have a responsible person to drive you home. 8. Someone MUST be with you the first 24 hours after you arrive home or your discharge will be delayed. 9. Please wear clothes that are easy to get on and off and wear slip-on shoes.  Thank you for allowing [place] to care for you!   -- [facility]

## 2020-10-20 NOTE — Progress Notes (Signed)
Sleep Apnea Evaluation Today's Date: 10/20/2020   Patient Name: Joanna Griffin        DOB: [birth year]       Height:        Weight: 138 lb 3.2 oz (62.7 kg)  BMI: Body mass index is 23 kg/m.   [facility]         Referring Provider:  Dr Gala Romney   STOP-BANG RISK ASSESSMENT    STOP-BANG 10/20/2020  Do you snore loudly? Yes  Do you often feel tired, fatigued, or sleepy during the daytime? Yes  Has anyone observed you stop breathing during sleep? No  Do you have (or are you being treated for) high blood pressure? Yes  Recent BMI (Calculated) 23.76  Is BMI greater than 35 kg/m2? 0=No  Age older than 58 years old? 1=Yes  Gender - Female 0=No      Procedure Order Form [For Office Use Only]   STOP-BANG Score ?3 OR two clinical symptoms - patient qualifies for WatchPAT (CPT 95800)             Clinical Notes: Will consult Sleep Specialist and refer for management of therapy due to patient increased risk of Sleep Apnea. Ordering a sleep study due to the following two clinical symptoms: Excessive daytime sleepiness G47.10 /  Loud snoring R06.83   I understand that I am proceeding with a home sleep apnea test as ordered by my treating physician. I understand that untreated sleep apnea is a serious cardiovascular risk factor and it is my responsibility to perform the test and seek management for sleep apnea. I will be contacted with the results and be managed for sleep apnea by a local sleep physician. I will be receiving equipment and further instructions from [facility]. I shall promptly ship back the equipment via the included mailing label. I understand my insurance will be billed for the test and as the patient I am responsible for any insurance related out-of-pocket costs incurred. I have been provided with written instructions and can call for additional video or telephonic instruction, with 24-hour availability of qualified personnel to answer any questions:  Patient Help Desk [phone].  Patient Signature ______________________________________________________   Date______________________

## 2020-10-20 NOTE — Progress Notes (Addendum)
ADVANCED HF CLINIC CONSULT NOTE  Referring Physician: Dr. Tomie China Primary Care: Simone Curia, MD Primary Cardiologist: Dr. 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 Tomie China for further evaluation of pulmonary HTN.   Had asymptomatic COVID in 2020.   Felt well and was working until 4/22. Then developed severe CP, SOB and LE edema. Was diuresed and referred to Dr. Tomie China. Subsequently referred for LHC.    LHC 4/22  There is hyperdynamic left ventricular systolic function.  Normal coronary arteries LV end diastolic pressure is moderately elevated. BP 162/94 (124) LV 169/27 The left ventricular ejection fraction is greater than 65% by visual estimate.   Echo 08/24/20: LVEF 70-75% + LVH G2 DD. RV dilated with normal function. RVSP . Massive biatrial enlargement   cMRI 09/14/20 1.  Normal LV size and systolic function (EF 68%). Minimal LGE at RV insertion. ECV 32% 2. Asymmetric LV hypertrophy measuring up to 30mm in basal anterior wall (60mm in posterior wall), which does not meet criteria for hypertrophic cardiomyopathy (<55mm) 4.  Severe RV dilatation with normal systolic function (EF 53%) 5. Moderate to severe tricuspid regurgitation.  6. Moderate pericardial effusion measuring up to 39mm adjacent to LV inferior wall 7. Moderate LAE/RAE  Says she really never checked BP before. FHx notable for HTN and DM2. Now on Farxiga 10, spiro 25, Toprol 50. Lasix only prn. SBP much improved.  Liver function tests were elevated. Referred to GI and told she had stage 3 liver disease. Hepatitis negative.  Says she has frequent palpitations. Breathing much better after diuresis. + orthopnea. No PND. Still with pain in back and chest   Non-smoke. No asthma. No family h/o CTD.     Review of Systems: [y] = yes, [ ]  = no   General: Weight gain [ ] ; Weight loss [ ] ; Anorexia [ ] ; Fatigue [ y]; Fever [ ] ; Chills [ ] ; Weakness [ ]   Cardiac:  Chest pain/pressure [ y]; Resting SOB [ y]; Exertional SOB [ y]; Orthopnea [ ] ; Pedal Edema [ y]; Palpitations [ y]; Syncope [ ] ; Presyncope [ ] ; Paroxysmal nocturnal dyspnea[ ]   Pulmonary: Cough [ ] ; Wheezing[ ] ; Hemoptysis[ ] ; Sputum [ ] ; Snoring ]  GI: Vomiting[ ] ; Dysphagia[ ] ; Melena[ ] ; Hematochezia [ ] ; Heartburn[ ] ; Abdominal pain [ ] ; Constipation [ ] ; Diarrhea [ ] ; BRBPR [ ]   GU: Hematuria[ ] ; Dysuria [ ] ; Nocturia[ ]   Vascular: Pain in legs with walking [ ] ; Pain in feet with lying flat [ ] ; Non-healing sores [ ] ; Stroke [ ] ; TIA [ ] ; Slurred speech [ ] ;  Neuro: Headaches[ y]; Vertigo[ ] ; Seizures[ ] ; Paresthesias[ ] ;Blurred vision [ ] ; Diplopia [ ] ; Vision changes [ ]   Ortho/Skin: Arthritis [ y]; Joint pain [ ] ; Muscle pain [ ] ; Joint swelling [ ] ; Back Pain [y]; Rash [ ]   Psych: Depressiony[ ] ; Anxiety[ y]  Heme: Bleeding problems [ ] ; Clotting disorders [ ] ; Anemia [ ]   Endocrine: Diabetes [ ] ; Thyroid dysfunction[ ]    Past Medical History:  Diagnosis Date   Abnormal liver enzymes    Allergic rhinitis    Angina pectoris (HCC) 07/30/2020   Anxiety    Depression    DOE (dyspnea on exertion) 07/30/2020   Dyspnea    Hyperglycemia    Hypertension    Hypertension, benign    Menopause    Migraine    Osteoarthritis    Paroxysmal atrial fibrillation (HCC)  Current Outpatient Medications  Medication Sig Dispense Refill   ALPRAZolam (XANAX) 0.25 MG tablet Take 0.25 mg by mouth every 8 (eight) hours as needed for anxiety.     aspirin EC 81 MG tablet Take 81 mg by mouth daily. Swallow whole.     dapagliflozin propanediol (FARXIGA) 10 MG TABS tablet Take 10 mg by mouth daily.     estradiol (ESTRACE) 2 MG tablet Take 2 mg by mouth daily.     furosemide (LASIX) 20 MG tablet Take 20 mg by mouth as needed for fluid.     loratadine (CLARITIN) 10 MG tablet Take 10 mg by mouth daily.     meloxicam (MOBIC) 15 MG tablet Take 15 mg by mouth daily as needed for pain.     metoprolol  succinate (TOPROL-XL) 25 MG 24 hr tablet Take 2 tablets (50 mg total) by mouth daily. Take with or immediately following a meal. 90 tablet 3   nitroGLYCERIN (NITROSTAT) 0.4 MG SL tablet Place 0.4 mg under the tongue every 5 (five) minutes as needed for chest pain.     spironolactone (ALDACTONE) 25 MG tablet Take 25 mg by mouth daily.     SUMAtriptan (IMITREX) 100 MG tablet Take 100 mg by mouth every 2 (two) hours as needed for migraine. May repeat in 2 hours if headache persists or recurs.     No current facility-administered medications for this encounter.    Allergies  Allergen Reactions   Codeine    Morphine And Related Nausea Only      Social History   Socioeconomic History   Marital status: Single    Spouse name: Not on file   Number of children: Not on file   Years of education: Not on file   Highest education level: Not on file  Occupational History   Not on file  Tobacco Use   Smoking status: Never   Smokeless tobacco: Never  Substance and Sexual Activity   Alcohol use: Never   Drug use: Never   Sexual activity: Not on file  Other Topics Concern   Not on file  Social History Narrative   Not on file   Social Determinants of Health   Financial Resource Strain: Not on file  Food Insecurity: Not on file  Transportation Needs: Not on file  Physical Activity: Not on file  Stress: Not on file  Social Connections: Not on file  Intimate Partner Violence: Not on file      Family History  Problem Relation Age of Onset   Hypertension Mother    Hypertension Father     Vitals:   10/20/20 1013  BP: 100/60  Pulse: 66  SpO2: 99%  Weight: 62.7 kg (138 lb 3.2 oz)    PHYSICAL EXAM: General:  Well appearing. No respiratory difficulty HEENT: normal Neck: supple. no JVD. Carotids 2+ bilat; no bruits. No lymphadenopathy or thryomegaly appreciated. Cor: PMI nondisplaced. Regular rate & rhythm. No rubs, gallops or murmurs. Lungs: clear Abdomen: soft, nontender,  nondistended. No hepatosplenomegaly. No bruits or masses. Good bowel sounds. Extremities: no cyanosis, clubbing, rash, edema Neuro: alert & oriented x 3, cranial nerves grossly intact. moves all 4 extremities w/o difficulty. Affect pleasant.  ECG: NSR 66 +LAE and u waves . No ST-T wave abnormalities. 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 In fact, BP may be a bit too low with mild AKI on recent labs - Will arrange for RHC cath this week - Also check auto-immune labs, sleep study - Can consider PFTs and VQ down the road but doubt these will provide much mor info  2. Cirrhosis - suspect due to high righ-sided pressures.  - follows with GI  3. Palpitations - place Zio  4. HTN - BP much improved. Now running low - cut spiro to 12.5  5. Snoring/fatigue - check sleep study  6. PAF - she does not know when last episode was. Has not been on [facility] - placing Zio for palpitations. - Can stop ASA - Decision on [facility] pending results of Zio.  Arvilla Meres, MD  11:53 AM

## 2020-10-20 NOTE — H&P (View-Only) (Signed)
ADVANCED HF CLINIC CONSULT NOTE  Referring Physician: Dr. Tomie China Primary Care: Simone Curia, MD Primary Cardiologist: Dr. 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 Tomie China for further evaluation of pulmonary HTN.   Had asymptomatic COVID in 2020.   Felt well and was working until 4/22. Then developed severe CP, SOB and LE edema. Was diuresed and referred to Dr. Tomie China. Subsequently referred for LHC.    LHC 4/22  There is hyperdynamic left ventricular systolic function.  Normal coronary arteries LV end diastolic pressure is moderately elevated. BP 162/94 (124) LV 169/27 The left ventricular ejection fraction is greater than 65% by visual estimate.   Echo 08/24/20: LVEF 70-75% + LVH G2 DD. RV dilated with normal function. RVSP . Massive biatrial enlargement   cMRI 09/14/20 1.  Normal LV size and systolic function (EF 68%). Minimal LGE at RV insertion. ECV 32% 2. Asymmetric LV hypertrophy measuring up to 30mm in basal anterior wall (60mm in posterior wall), which does not meet criteria for hypertrophic cardiomyopathy (<55mm) 4.  Severe RV dilatation with normal systolic function (EF 53%) 5. Moderate to severe tricuspid regurgitation.  6. Moderate pericardial effusion measuring up to 39mm adjacent to LV inferior wall 7. Moderate LAE/RAE  Says she really never checked BP before. FHx notable for HTN and DM2. Now on Farxiga 10, spiro 25, Toprol 50. Lasix only prn. SBP much improved.  Liver function tests were elevated. Referred to GI and told she had stage 3 liver disease. Hepatitis negative.  Says she has frequent palpitations. Breathing much better after diuresis. + orthopnea. No PND. Still with pain in back and chest   Non-smoke. No asthma. No family h/o CTD.     Review of Systems: [y] = yes, [ ]  = no   General: Weight gain [ ] ; Weight loss [ ] ; Anorexia [ ] ; Fatigue [ y]; Fever [ ] ; Chills [ ] ; Weakness [ ]   Cardiac:  Chest pain/pressure [ y]; Resting SOB [ y]; Exertional SOB [ y]; Orthopnea [ ] ; Pedal Edema [ y]; Palpitations [ y]; Syncope [ ] ; Presyncope [ ] ; Paroxysmal nocturnal dyspnea[ ]   Pulmonary: Cough [ ] ; Wheezing[ ] ; Hemoptysis[ ] ; Sputum [ ] ; Snoring ]  GI: Vomiting[ ] ; Dysphagia[ ] ; Melena[ ] ; Hematochezia [ ] ; Heartburn[ ] ; Abdominal pain [ ] ; Constipation [ ] ; Diarrhea [ ] ; BRBPR [ ]   GU: Hematuria[ ] ; Dysuria [ ] ; Nocturia[ ]   Vascular: Pain in legs with walking [ ] ; Pain in feet with lying flat [ ] ; Non-healing sores [ ] ; Stroke [ ] ; TIA [ ] ; Slurred speech [ ] ;  Neuro: Headaches[ y]; Vertigo[ ] ; Seizures[ ] ; Paresthesias[ ] ;Blurred vision [ ] ; Diplopia [ ] ; Vision changes [ ]   Ortho/Skin: Arthritis [ y]; Joint pain [ ] ; Muscle pain [ ] ; Joint swelling [ ] ; Back Pain [y]; Rash [ ]   Psych: Depressiony[ ] ; Anxiety[ y]  Heme: Bleeding problems [ ] ; Clotting disorders [ ] ; Anemia [ ]   Endocrine: Diabetes [ ] ; Thyroid dysfunction[ ]    Past Medical History:  Diagnosis Date   Abnormal liver enzymes    Allergic rhinitis    Angina pectoris (HCC) 07/30/2020   Anxiety    Depression    DOE (dyspnea on exertion) 07/30/2020   Dyspnea    Hyperglycemia    Hypertension    Hypertension, benign    Menopause    Migraine    Osteoarthritis    Paroxysmal atrial fibrillation (HCC)  Current Outpatient Medications  Medication Sig Dispense Refill   ALPRAZolam (XANAX) 0.25 MG tablet Take 0.25 mg by mouth every 8 (eight) hours as needed for anxiety.     aspirin EC 81 MG tablet Take 81 mg by mouth daily. Swallow whole.     dapagliflozin propanediol (FARXIGA) 10 MG TABS tablet Take 10 mg by mouth daily.     estradiol (ESTRACE) 2 MG tablet Take 2 mg by mouth daily.     furosemide (LASIX) 20 MG tablet Take 20 mg by mouth as needed for fluid.     loratadine (CLARITIN) 10 MG tablet Take 10 mg by mouth daily.     meloxicam (MOBIC) 15 MG tablet Take 15 mg by mouth daily as needed for pain.     metoprolol  succinate (TOPROL-XL) 25 MG 24 hr tablet Take 2 tablets (50 mg total) by mouth daily. Take with or immediately following a meal. 90 tablet 3   nitroGLYCERIN (NITROSTAT) 0.4 MG SL tablet Place 0.4 mg under the tongue every 5 (five) minutes as needed for chest pain.     spironolactone (ALDACTONE) 25 MG tablet Take 25 mg by mouth daily.     SUMAtriptan (IMITREX) 100 MG tablet Take 100 mg by mouth every 2 (two) hours as needed for migraine. May repeat in 2 hours if headache persists or recurs.     No current facility-administered medications for this encounter.    Allergies  Allergen Reactions   Codeine    Morphine And Related Nausea Only      Social History   Socioeconomic History   Marital status: Single    Spouse name: Not on file   Number of children: Not on file   Years of education: Not on file   Highest education level: Not on file  Occupational History   Not on file  Tobacco Use   Smoking status: Never   Smokeless tobacco: Never  Substance and Sexual Activity   Alcohol use: Never   Drug use: Never   Sexual activity: Not on file  Other Topics Concern   Not on file  Social History Narrative   Not on file   Social Determinants of Health   Financial Resource Strain: Not on file  Food Insecurity: Not on file  Transportation Needs: Not on file  Physical Activity: Not on file  Stress: Not on file  Social Connections: Not on file  Intimate Partner Violence: Not on file      Family History  Problem Relation Age of Onset   Hypertension Mother    Hypertension Father     Vitals:   10/20/20 1013  BP: 100/60  Pulse: 66  SpO2: 99%  Weight: 62.7 kg (138 lb 3.2 oz)    PHYSICAL EXAM: General:  Well appearing. No respiratory difficulty HEENT: normal Neck: supple. no JVD. Carotids 2+ bilat; no bruits. No lymphadenopathy or thryomegaly appreciated. Cor: PMI nondisplaced. Regular rate & rhythm. No rubs, gallops or murmurs. Lungs: clear Abdomen: soft, nontender,  nondistended. No hepatosplenomegaly. No bruits or masses. Good bowel sounds. Extremities: no cyanosis, clubbing, rash, edema Neuro: alert & oriented x 3, cranial nerves grossly intact. moves all 4 extremities w/o difficulty. Affect pleasant.  ECG: NSR 66 +LAE and u waves . No ST-T wave abnormalities. 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 In fact, BP may be a bit too low with mild AKI on recent labs - Will arrange for RHC cath this week - Also check auto-immune labs, sleep study - Can consider PFTs and VQ down the road but doubt these will provide much mor info  2. Cirrhosis - suspect due to high righ-sided pressures.  - follows with GI  3. Palpitations - place Zio  4. HTN - BP much improved. Now running low - cut spiro to 12.5  5. Snoring/fatigue - check sleep study  6. PAF - she does not know when last episode was. Has not been on [facility] - placing Zio for palpitations. - Can stop ASA - Decision on [facility] pending results of Zio.  Arvilla Meres, MD  11:53 AM

## 2020-10-21 LAB — ANTI-SCLERODERMA ANTIBODY: Scleroderma (Scl-70) (ENA) Antibody, IgG: <0.2 AI (ref 0.0–0.9)

## 2020-10-21 LAB — RHEUMATOID FACTOR: Rheumatoid fact SerPl-aCnc: 19.8 IU/mL — ABNORMAL HIGH (ref <14.0)

## 2020-10-21 LAB — ANA W/REFLEX: Anti Nuclear Antibody (ANA): NEGATIVE

## 2020-10-21 LAB — ANCA TITERS
Atypical P-ANCA titer: <1:20 {titer}
C-ANCA: <1:20 {titer}
P-ANCA: <1:20 {titer}

## 2020-10-22 ENCOUNTER — Ambulatory Visit
Admission: RE | Admit: 2020-10-22 | Discharge: 2020-10-22 | Disposition: A | Discharge status: Home / Self Care | Payer: PRIVATE HEALTH INSURANCE | Source: Ambulatory Visit | Attending: Nurse Practitioner | Admitting: Nurse Practitioner

## 2020-10-22 DIAGNOSIS — B182 Chronic viral hepatitis C: Secondary | ICD-10-CM

## 2020-10-22 LAB — CYCLIC CITRUL PEPTIDE ANTIBODY, IGG/IGA: CCP Antibodies IgG/IgA: 4 units (ref 0–19)

## 2020-10-23 ENCOUNTER — Encounter (HOSPITAL_COMMUNITY)
Admission: RE | Disposition: A | Discharge status: Home / Self Care | Payer: Self-pay | Source: Ambulatory Visit | Attending: Internal Medicine

## 2020-10-23 ENCOUNTER — Encounter (HOSPITAL_COMMUNITY): Payer: Self-pay | Admitting: Internal Medicine

## 2020-10-23 ENCOUNTER — Other Ambulatory Visit: Payer: Self-pay

## 2020-10-23 ENCOUNTER — Ambulatory Visit (HOSPITAL_COMMUNITY)
Admission: RE | Admit: 2020-10-23 | Discharge: 2020-10-23 | Disposition: A | Discharge status: Home / Self Care | Payer: PRIVATE HEALTH INSURANCE | Source: Ambulatory Visit | Attending: Internal Medicine | Admitting: Internal Medicine

## 2020-10-23 DIAGNOSIS — Z79899 Other long term (current) drug therapy: Secondary | ICD-10-CM | POA: Insufficient documentation

## 2020-10-23 DIAGNOSIS — R0683 Snoring: Secondary | ICD-10-CM | POA: Diagnosis not present

## 2020-10-23 DIAGNOSIS — Z8249 Family history of ischemic heart disease and other diseases of the circulatory system: Secondary | ICD-10-CM | POA: Insufficient documentation

## 2020-10-23 DIAGNOSIS — R5383 Other fatigue: Secondary | ICD-10-CM | POA: Insufficient documentation

## 2020-10-23 DIAGNOSIS — Z7984 Long term (current) use of oral hypoglycemic drugs: Secondary | ICD-10-CM | POA: Insufficient documentation

## 2020-10-23 DIAGNOSIS — Z7982 Long term (current) use of aspirin: Secondary | ICD-10-CM | POA: Insufficient documentation

## 2020-10-23 DIAGNOSIS — I2722 Pulmonary hypertension due to left heart disease: Secondary | ICD-10-CM | POA: Insufficient documentation

## 2020-10-23 DIAGNOSIS — I2721 Secondary pulmonary arterial hypertension: Secondary | ICD-10-CM | POA: Diagnosis present

## 2020-10-23 DIAGNOSIS — I480 Paroxysmal atrial fibrillation: Secondary | ICD-10-CM | POA: Diagnosis not present

## 2020-10-23 DIAGNOSIS — Z885 Allergy status to narcotic agent status: Secondary | ICD-10-CM | POA: Insufficient documentation

## 2020-10-23 DIAGNOSIS — I119 Hypertensive heart disease without heart failure: Secondary | ICD-10-CM | POA: Diagnosis not present

## 2020-10-23 DIAGNOSIS — I272 Pulmonary hypertension, unspecified: Secondary | ICD-10-CM

## 2020-10-23 DIAGNOSIS — K7460 Unspecified cirrhosis of liver: Secondary | ICD-10-CM | POA: Diagnosis not present

## 2020-10-23 DIAGNOSIS — R002 Palpitations: Secondary | ICD-10-CM | POA: Insufficient documentation

## 2020-10-23 HISTORY — PX: RIGHT HEART CATH: CATH118263

## 2020-10-23 LAB — POCT I-STAT EG7
Acid-base deficit: 1 mmol/L (ref 0.0–2.0)
Acid-base deficit: 1 mmol/L (ref 0.0–2.0)
Acid-base deficit: 2 mmol/L (ref 0.0–2.0)
Acid-base deficit: 2 mmol/L (ref 0.0–2.0)
Acid-base deficit: 2 mmol/L (ref 0.0–2.0)
Acid-base deficit: 2 mmol/L (ref 0.0–2.0)
Bicarbonate: 21.7 mmol/L (ref 20.0–28.0)
Bicarbonate: 22 mmol/L (ref 20.0–28.0)
Bicarbonate: 22.1 mmol/L (ref 20.0–28.0)
Bicarbonate: 22.3 mmol/L (ref 20.0–28.0)
Bicarbonate: 22.5 mmol/L (ref 20.0–28.0)
Bicarbonate: 23.3 mmol/L (ref 20.0–28.0)
Calcium, Ion: 1.03 mmol/L — ABNORMAL LOW (ref 1.15–1.40)
Calcium, Ion: 1.11 mmol/L — ABNORMAL LOW (ref 1.15–1.40)
Calcium, Ion: 1.14 mmol/L — ABNORMAL LOW (ref 1.15–1.40)
Calcium, Ion: 1.19 mmol/L (ref 1.15–1.40)
Calcium, Ion: 1.23 mmol/L (ref 1.15–1.40)
Calcium, Ion: 1.24 mmol/L (ref 1.15–1.40)
HCT: 48 % — ABNORMAL HIGH (ref 36.0–46.0)
HCT: 48 % — ABNORMAL HIGH (ref 36.0–46.0)
HCT: 49 % — ABNORMAL HIGH (ref 36.0–46.0)
HCT: 49 % — ABNORMAL HIGH (ref 36.0–46.0)
HCT: 50 % — ABNORMAL HIGH (ref 36.0–46.0)
HCT: 50 % — ABNORMAL HIGH (ref 36.0–46.0)
Hemoglobin: 16.3 g/dL — ABNORMAL HIGH (ref 12.0–15.0)
Hemoglobin: 16.3 g/dL — ABNORMAL HIGH (ref 12.0–15.0)
Hemoglobin: 16.7 g/dL — ABNORMAL HIGH (ref 12.0–15.0)
Hemoglobin: 16.7 g/dL — ABNORMAL HIGH (ref 12.0–15.0)
Hemoglobin: 17 g/dL — ABNORMAL HIGH (ref 12.0–15.0)
Hemoglobin: 17 g/dL — ABNORMAL HIGH (ref 12.0–15.0)
O2 Saturation: 77 %
O2 Saturation: 82 %
O2 Saturation: 88 %
O2 Saturation: 89 %
O2 Saturation: 89 %
O2 Saturation: 91 %
Potassium: 3.9 mmol/L (ref 3.5–5.1)
Potassium: 4 mmol/L (ref 3.5–5.1)
Potassium: 4 mmol/L (ref 3.5–5.1)
Potassium: 4.1 mmol/L (ref 3.5–5.1)
Potassium: 4.2 mmol/L (ref 3.5–5.1)
Potassium: 4.2 mmol/L (ref 3.5–5.1)
Sodium: 137 mmol/L (ref 135–145)
Sodium: 137 mmol/L (ref 135–145)
Sodium: 138 mmol/L (ref 135–145)
Sodium: 138 mmol/L (ref 135–145)
Sodium: 139 mmol/L (ref 135–145)
Sodium: 140 mmol/L (ref 135–145)
TCO2: 23 mmol/L (ref 22–32)
TCO2: 23 mmol/L (ref 22–32)
TCO2: 23 mmol/L (ref 22–32)
TCO2: 23 mmol/L (ref 22–32)
TCO2: 24 mmol/L (ref 22–32)
TCO2: 24 mmol/L (ref 22–32)
pCO2, Ven: 34.3 mmHg — ABNORMAL LOW (ref 44.0–60.0)
pCO2, Ven: 34.4 mmHg — ABNORMAL LOW (ref 44.0–60.0)
pCO2, Ven: 34.9 mmHg — ABNORMAL LOW (ref 44.0–60.0)
pCO2, Ven: 34.9 mmHg — ABNORMAL LOW (ref 44.0–60.0)
pCO2, Ven: 35.3 mmHg — ABNORMAL LOW (ref 44.0–60.0)
pCO2, Ven: 36.4 mmHg — ABNORMAL LOW (ref 44.0–60.0)
pH, Ven: 7.409 (ref 7.250–7.430)
pH, Ven: 7.41 (ref 7.250–7.430)
pH, Ven: 7.412 (ref 7.250–7.430)
pH, Ven: 7.413 (ref 7.250–7.430)
pH, Ven: 7.414 (ref 7.250–7.430)
pH, Ven: 7.414 (ref 7.250–7.430)
pO2, Ven: 41 mmHg (ref 32.0–45.0)
pO2, Ven: 45 mmHg (ref 32.0–45.0)
pO2, Ven: 54 mmHg — ABNORMAL HIGH (ref 32.0–45.0)
pO2, Ven: 54 mmHg — ABNORMAL HIGH (ref 32.0–45.0)
pO2, Ven: 56 mmHg — ABNORMAL HIGH (ref 32.0–45.0)
pO2, Ven: 59 mmHg — ABNORMAL HIGH (ref 32.0–45.0)

## 2020-10-23 SURGERY — RIGHT HEART CATH
Anesthesia: LOCAL

## 2020-10-23 MED ORDER — LIDOCAINE HCL (PF) 1 % IJ SOLN
INTRAMUSCULAR | Status: AC
  Filled 2020-10-23: qty 30

## 2020-10-23 MED ORDER — HEPARIN (PORCINE) IN NACL 1000-0.9 UT/500ML-% IV SOLN
INTRAVENOUS | Status: DC | PRN
  Administered 2020-10-23: 500 mL

## 2020-10-23 MED ORDER — ASPIRIN 81 MG PO CHEW: 81.0000 mg | CHEWABLE_TABLET | ORAL | Status: DC

## 2020-10-23 MED ORDER — SODIUM CHLORIDE 0.9 % IV SOLN: INTRAVENOUS | Status: DC

## 2020-10-23 MED ORDER — HEPARIN (PORCINE) IN NACL 1000-0.9 UT/500ML-% IV SOLN
INTRAVENOUS | Status: AC
  Filled 2020-10-23: qty 500

## 2020-10-23 MED ORDER — SODIUM CHLORIDE 0.9 % IV SOLN: 250.0000 mL | INTRAVENOUS | Status: DC | PRN

## 2020-10-23 MED ORDER — LABETALOL HCL 5 MG/ML IV SOLN: 10.0000 mg | INTRAVENOUS | Status: DC | PRN

## 2020-10-23 MED ORDER — SODIUM CHLORIDE 0.9% FLUSH: 3.0000 mL | INTRAVENOUS | Status: DC | PRN

## 2020-10-23 MED ORDER — HYDRALAZINE HCL 20 MG/ML IJ SOLN: 10.0000 mg | INTRAMUSCULAR | Status: DC | PRN

## 2020-10-23 MED ORDER — LIDOCAINE HCL (PF) 1 % IJ SOLN
INTRAMUSCULAR | Status: DC | PRN
  Administered 2020-10-23: 2 mL

## 2020-10-23 MED ORDER — SODIUM CHLORIDE 0.9% FLUSH: 3.0000 mL | Freq: Two times a day (BID) | INTRAVENOUS | Status: DC

## 2020-10-23 MED ORDER — ONDANSETRON HCL 4 MG/2ML IJ SOLN: 4.0000 mg | Freq: Four times a day (QID) | INTRAMUSCULAR | Status: DC | PRN

## 2020-10-23 MED ORDER — ACETAMINOPHEN 325 MG PO TABS: 650.0000 mg | ORAL_TABLET | ORAL | Status: DC | PRN

## 2020-10-23 MED ORDER — SODIUM CHLORIDE 0.9 % IV SOLN
250.0000 mL | INTRAVENOUS | Status: DC | PRN
Start: 2020-10-23 — End: 2020-10-23

## 2020-10-23 SURGICAL SUPPLY — 8 items
CATH BALLN WEDGE 5F 110CM (CATHETERS) ×1 IMPLANT
PACK CARDIAC CATHETERIZATION (CUSTOM PROCEDURE TRAY) ×2 IMPLANT
PROTECTION STATION PRESSURIZED (MISCELLANEOUS) ×2
SHEATH GLIDE SLENDER 4/5FR (SHEATH) ×1 IMPLANT
STATION PROTECTION PRESSURIZED (MISCELLANEOUS) IMPLANT
TRANSDUCER W/STOPCOCK (MISCELLANEOUS) ×2 IMPLANT
TUBING ART PRESS 72  MALE/FEM (TUBING) ×2
TUBING ART PRESS 72 MALE/FEM (TUBING) IMPLANT

## 2020-10-23 NOTE — Interval H&P Note (Signed)
History and Physical Interval Note:  10/23/2020 7:49 AM  Joanna Griffin  has presented today for surgery, with the diagnosis of PAH.  The various methods of treatment have been discussed with the patient and family. After consideration of risks, benefits and other options for treatment, the patient has consented to  Procedure(s): RIGHT HEART CATH (N/A) as a surgical intervention.  The patient's history has been reviewed, patient examined, no change in status, stable for surgery.  I have reviewed the patient's chart and labs.  Questions were answered to the patient's satisfaction.     Chaya Dehaan

## 2020-10-23 NOTE — Discharge Instructions (Signed)
No changes were made to your medications. Do not lift anything heavier than 10 lbs for 2 days with right arm. Monitor for signs/symptoms of infection (redness, swelling, odor). Call 911 with large amount of bleeding and hold pressure over bend in right arm for 20 minutes. May remove dressing from the bend of right arm tomorrow morning (10/24/20 @ 0800) and shower.

## 2020-10-28 ENCOUNTER — Other Ambulatory Visit: Payer: Self-pay

## 2020-10-28 ENCOUNTER — Ambulatory Visit (INDEPENDENT_AMBULATORY_CARE_PROVIDER_SITE_OTHER): Payer: PRIVATE HEALTH INSURANCE | Admitting: Cardiology

## 2020-10-28 ENCOUNTER — Encounter: Payer: Self-pay | Admitting: Cardiology

## 2020-10-28 VITALS — BP 124/70 | HR 78 | Ht 65.0 in | Wt 138.0 lb

## 2020-10-28 DIAGNOSIS — I272 Pulmonary hypertension, unspecified: Secondary | ICD-10-CM

## 2020-10-28 DIAGNOSIS — R0602 Shortness of breath: Secondary | ICD-10-CM

## 2020-10-28 DIAGNOSIS — I480 Paroxysmal atrial fibrillation: Secondary | ICD-10-CM

## 2020-10-28 DIAGNOSIS — K7469 Other cirrhosis of liver: Secondary | ICD-10-CM

## 2020-10-28 DIAGNOSIS — R5383 Other fatigue: Secondary | ICD-10-CM

## 2020-10-28 DIAGNOSIS — I10 Essential (primary) hypertension: Secondary | ICD-10-CM

## 2020-10-28 NOTE — Patient Instructions (Signed)
Medication Instructions:  Your physician recommends that you continue on your current medications as directed. Please refer to the Current Medication list given to you today.  *If you need a refill on your cardiac medications before your next appointment, please call your pharmacy*   Lab Work: None If you have labs (blood work) drawn today and your tests are completely normal, you will receive your results only by: MyChart Message (if you have MyChart) OR A paper copy in the mail If you have any lab test that is abnormal or we need to change your treatment, we will call you to review the results.   Testing/Procedures: Your physician has recommended that you have a pulmonary function test. Pulmonary Function Tests are a group of tests that measure how well air moves in and out of your lungs.   Follow-Up: At [facility], you and your health needs are our priority.  As part of our continuing mission to provide you with exceptional heart care, we have created designated Provider Care Teams.  These Care Teams include your primary Cardiologist (physician) and Advanced Practice Providers (APPs -  Physician Assistants and Nurse Practitioners) who all work together to provide you with the care you need, when you need it.  We recommend signing up for the patient portal called "MyChart".  Sign up information is provided on this After Visit Summary.  MyChart is used to connect with patients for Virtual Visits (Telemedicine).  Patients are able to view lab/test results, encounter notes, upcoming appointments, etc.  Non-urgent messages can be sent to your provider as well.   To learn more about what you can do with MyChart, go to [email or web address].    Your next appointment:   10 week(s)  The format for your next appointment:   In Person   Other Instructions Pulmonary Function Tests Pulmonary function tests (PFTs) are breathing tests that are used to measure how well your lungs work, find  out what is causing your lung problems, and figure out the best treatment for you. You may have PFTs: When you have an illness involving your lungs. To watch for changes in your lung function over time if you have a long-term (chronic) lung disease. If you are an IT trainer. PFTs check the effects of being exposed to chemicals over a long period of time. To check for diseases that affect the lungs, such as asthma or chronic obstructive pulmonary disease (COPD). To check lung function before having surgery or other procedures. To check your lungs, if you smoke. To check if prescribed medicines or treatments are helping your lungs. Your results will be compared with the expected lung function of someone with healthy lungs who is similar to you in several ways. These include age, sex, height, weight, and race or ethnicity. This is done to show how your lung function compares with normal lung function (percent predicted). The percent predicted helps your health care provider to know if your lung function is normal or not. If you have had PFTs done before, your health care provider will compare your current results with past results. This shows ifyour lung function is better, worse, or the same as before. Tell a health care provider about: Any allergies you have. All medicines you are taking, including inhaler or nebulizer medicines, vitamins, herbs, eye drops, creams, and over-the-counter medicines. Any blood disorders you have. Any surgeries you have had, especially recent surgery of the eye, abdomen, or chest. These can make PFTs difficult or unsafe. Any medical  conditions you have, including chest pain or heart problems, tuberculosis, or respiratory infections such as pneumonia, a cold, or the flu. Any fear of being in closed spaces (claustrophobia). Some of your tests may be in a closed space. What are the risks? Generally, this is a safe procedure. However, problems may occur,  including: Feeling light-headed due to fast, deep breathing known as over-breathing or hyperventilation. An asthma attack from deep breathing. What happens before the procedure? Take over-the-counter and prescription medicines only as told by your health care provider. If you take inhaler or nebulizer medicines, ask your health care provider which medicines you should take on the day of your testing. Some inhaler medicines may interfere with PFTs if they are taken shortly before the tests. Follow instructions from your health care provider about eating or drinking restrictions. These may include avoiding eating large meals and avoiding using caffeine before the testing. Do not drink alcohol for up to 4 hours before the test. Do not smoke for up to 4 hours before your procedure. This includes e-cigarettes. Wear comfortable clothing that will not interfere with breathing. Avoid exercise that takes a lot of effort (strenuous exercise) for at least 30 minutes before the testing. What happens during the procedure?  You will be given a soft nose clip to wear. This is done so all of your breaths will go through your mouth instead of your nose. You will be given a germ-free (sterile) mouthpiece. It will be attached to a machine, called a spirometer, that measures your breathing. You will be asked to do various breathing exercises. The exercises will be done by breathing in (inhaling) and breathing out (exhaling). You may be asked to repeat the exercises several times before the testing is complete. It is important to follow the instructions exactly to get accurate results. Make sure to blow as hard and as fast as you can when you are told to do so. You may be given a medicine called a bronchodilator that makes the small air passages in your lungs larger. This medicine will make it easier for you to breathe. The tests will then be repeated after the medicine takes effect. You will be watched carefully during  the procedure for any problems, such as feeling faint or dizzy, or having trouble breathing. The procedure may vary among health care providers and hospitals. What can I expect after the test procedure? It is up to you to get the results of your procedure. Ask your health care provider, or the department that is doing the procedure, when your results will be ready. After you have received your results, talk with your health careprovider about treatment options, if necessary. Follow these instructions at home: Do not use any products that contain nicotine or tobacco, such as cigarettes, e-cigarettes, and chewing tobacco. If you need help quitting, ask your healthcare provider. Summary Pulmonary function tests (PFTs) are used to measure how well your lungs work, find out what is causing your lung problems, and figure out the best treatment for you. If you have had PFTs done before, your health care provider will compare your current results with past results. This shows if your lung function is better, worse, or the same as before. Wear comfortable clothing that will not interfere with breathing when you are tested. It is up to you to get the results of your procedure. After you have received them, talk with your health care provider about treatment options, if necessary. This information is not intended to replace  advice given to you by your health care provider. Make sure you discuss any questions you have with your healthcare provider. Document Revised: 04/09/2019 Document Reviewed: 04/09/2019 [employer]vier Patient Education  2022 [employer].

## 2020-10-28 NOTE — Progress Notes (Signed)
Cardiology Office Note:    Date:  10/29/2020   ID:  Joanna Griffin, DOB [birth year], MRN [id number]  PCP:  Simone Curia, MD  Cardiologist:  Thomasene Ripple, DO  Electrophysiologist:  None   Referring MD: Simone Curia, MD   " I am still short of breath"   History of Present Illness:    Joanna Griffin is a 60 y.o. female with a hx of paroxysmal defibrillation, hypertension, pulmonary hypertension, stage III liver disease initially did see Dr. Tomie China at which time she was short of breath and leg edema she was sent subsequently for left heart catheterization that was normal.  She was then referred to our advanced heart failure team given concern for severe pulmonary hypertension.  She recently had a right heart catheterization which showed mild pulmonary hypertension.  There is concern for intra-arterial shunting and heart failure team is planning for a possible TEE.  She also had a cardiac MRI on Sep 14, 2020 which showed severe RV dilatation with moderate to severe tricuspid regurgitation. In the meantime the patient requested to switch providers and is here for a follow-up visit.  She tells me that she is still experiencing shortness of breath and exertion.  She says that is still worse that she is unable to do basic activities at her house.  Is unable to clean her house to her laundry and practically gets short of breath when she stands up to walk.  She feels this is debilitating.   Past Medical History:  Diagnosis Date   Abnormal liver enzymes    Allergic rhinitis    Angina pectoris (HCC) 07/30/2020   Anxiety    Depression    DOE (dyspnea on exertion) 07/30/2020   Dyspnea    Hyperglycemia    Hypertension    Hypertension, benign    Menopause    Migraine    Osteoarthritis    Paroxysmal atrial fibrillation [facility])     Past Surgical History:  Procedure Laterality Date   LEFT HEART CATH AND CORONARY ANGIOGRAPHY N/A 08/03/2020   Procedure: LEFT HEART CATH AND CORONARY ANGIOGRAPHY;   Surgeon: Lennette Bihari, MD;  Location: [facility] INVASIVE CV [facility];  Service: Cardiovascular;  Laterality: N/A;   NO PAST SURGERIES     RIGHT HEART CATH N/A 10/23/2020   Procedure: RIGHT HEART CATH;  Surgeon: Dolores Patty, MD;  Location: [facility] INVASIVE CV LAB;  Service: Cardiovascular;  Laterality: N/A;    Current Medications: Current Meds  Medication Sig   acetaminophen (TYLENOL) 325 MG tablet Take 650 mg by mouth every 6 (six) hours as needed for moderate pain.   ALPRAZolam (XANAX) 0.25 MG tablet Take 0.25 mg by mouth every 8 (eight) hours as needed for anxiety.   dapagliflozin propanediol (FARXIGA) 10 MG TABS tablet Take 10 mg by mouth daily.   estradiol (ESTRACE) 2 MG tablet Take 2 mg by mouth daily.   furosemide (LASIX) 20 MG tablet Take 20 mg by mouth as needed for fluid.   loratadine (CLARITIN) 10 MG tablet Take 10 mg by mouth daily as needed for allergies.   meloxicam (MOBIC) 15 MG tablet Take 15 mg by mouth daily as needed for pain.   Menthol-Methyl Salicylate (BEN GAY GREASELESS) 10-15 % greaseless cream Apply 1 application topically daily as needed for pain.   metoprolol succinate (TOPROL-XL) 25 MG 24 hr tablet Take 2 tablets (50 mg total) by mouth daily. Take with or immediately following a meal.   nitroGLYCERIN (NITROSTAT) 0.4 MG SL tablet Place  0.4 mg under the tongue every 5 (five) minutes as needed for chest pain.   sennosides-docusate sodium (SENOKOT-S) 8.6-50 MG tablet Take 2 tablets by mouth daily.   spironolactone (ALDACTONE) 25 MG tablet Take 0.5 tablets (12.5 mg total) by mouth daily.   SUMAtriptan (IMITREX) 100 MG tablet Take 100 mg by mouth every 2 (two) hours as needed for migraine. May repeat in 2 hours if headache persists or recurs.     Allergies:   Hydrocodone-acetaminophen, Codeine, and Morphine and related   Social History   Socioeconomic History   Marital status: Single    Spouse name: Not on file   Number of children: Not on file   Years of education: Not  on file   Highest education level: Not on file  Occupational History   Not on file  Tobacco Use   Smoking status: Never   Smokeless tobacco: Never  Substance and Sexual Activity   Alcohol use: Never   Drug use: Never   Sexual activity: Not on file  Other Topics Concern   Not on file  Social History Narrative   Not on file   Social Determinants of Health   Financial Resource Strain: Not on file  Food Insecurity: Not on file  Transportation Needs: Not on file  Physical Activity: Not on file  Stress: Not on file  Social Connections: Not on file     Family History: The patient's family history includes Hypertension in her father and mother.  ROS:   Review of Systems  Constitution: Negative for decreased appetite, fever and weight gain.  HENT: Negative for congestion, ear discharge, hoarse voice and sore throat.   Eyes: Negative for discharge, redness, vision loss in right eye and visual halos.  Cardiovascular: Negative for chest pain, dyspnea on exertion, leg swelling, orthopnea and palpitations.  Respiratory: Negative for cough, hemoptysis, shortness of breath and snoring.   Endocrine: Negative for heat intolerance and polyphagia.  Hematologic/Lymphatic: Negative for bleeding problem. Does not bruise/bleed easily.  Skin: Negative for flushing, nail changes, rash and suspicious lesions.  Musculoskeletal: Negative for arthritis, joint pain, muscle cramps, myalgias, neck pain and stiffness.  Gastrointestinal: Negative for abdominal pain, bowel incontinence, diarrhea and excessive appetite.  Genitourinary: Negative for decreased libido, genital sores and incomplete emptying.  Neurological: Negative for brief paralysis, focal weakness, headaches and loss of balance.  Psychiatric/Behavioral: Negative for altered mental status, depression and suicidal ideas.  Allergic/Immunologic: Negative for HIV exposure and persistent infections.    EKGs/Labs/Other Studies Reviewed:    The  following studies were reviewed today:   EKG:  None today  RHC 10/23/2020 Findings:   RA = 6 RV = 38/8 PA = 40/17 (24) PCW = 19 Fick cardiac output/index = 7.0/4.1 PVR = 2.0 WU Ao sat = 99% High SVC sat = 77% High RA = 82% Low RA = 91% RV = 89% PA = 87%, 88% Qp/Qs = 1.09   Assessment: 1. 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  TTE 08/24/2020 IMPRESSIONS   1. 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   2. Left ventricular ejection fraction, by estimation, is 70 to 75%. The  left ventricle has hyperdynamic function. The left ventricle has no  regional wall motion abnormalities. There is moderate concentric left  ventricular hypertrophy. Left ventricular  diastolic  parameters are consistent with Grade II diastolic dysfunction  (pseudonormalization).   3. Right ventricular systolic function is normal. The right ventricular  size is normal. There is severely elevated pulmonary artery systolic  pressure.   4. Left atrial size was severely dilated.   5. Right atrial size was severely dilated.   6. The mitral valve is normal in structure. Mild mitral valve  regurgitation. No evidence of mitral stenosis.   7. Tricuspid valve regurgitation is moderate to severe.   8. The aortic valve is tricuspid. Aortic valve regurgitation is not  visualized. No aortic stenosis is present.   9. The inferior vena cava is dilated in size with <50% respiratory  variability, suggesting right atrial pressure of 15 mmHg.   FINDINGS   Left Ventricle: Left ventricular ejection fraction, by estimation, is 70  to 75%. The left ventricle has hyperdynamic function. The left ventricle  has no regional wall motion abnormalities. The left ventricular internal  cavity  size was normal in size.  There is moderate concentric left ventricular hypertrophy. Left  ventricular diastolic parameters are consistent with Grade II diastolic  dysfunction (pseudonormalization). Indeterminate filling pressures.   Right Ventricle: The right ventricular size is normal. No increase in  right ventricular wall thickness. Right ventricular systolic function is  normal. There is severely elevated pulmonary artery systolic pressure. The  tricuspid regurgitant velocity is  3.36 m/s, and with an assumed right atrial pressure of 15 mmHg, the  estimated right ventricular systolic pressure is 60.2 mmHg.   Left Atrium: Left atrial size was severely dilated.   Right Atrium: Right atrial size was severely dilated.   Pericardium: There is no evidence of pericardial effusion.   Mitral Valve: The mitral valve is normal in structure. Mild mitral valve  regurgitation. No evidence of mitral valve stenosis.   Tricuspid Valve: The tricuspid valve is normal in structure. Tricuspid  valve regurgitation is moderate to severe. No evidence of tricuspid  stenosis.   Aortic Valve: The aortic valve is tricuspid. Aortic valve regurgitation is  not visualized. No aortic stenosis is present.   Pulmonic Valve: The pulmonic valve was normal in structure. Pulmonic valve  regurgitation is mild. No evidence of pulmonic stenosis.   Aorta: The aortic root is normal in size and structure.   Venous: The inferior vena cava is dilated in size with less than 50%  respiratory variability, suggesting right atrial pressure of 15 mmHg.   IAS/Shunts: No atrial level shunt detected by color flow Doppler.   LHC 08/03/2020 There is hyperdynamic left ventricular systolic function. LV end diastolic pressure is moderately elevated. The left ventricular ejection fraction is greater than 65% by visual estimate.   Normal epicardial coronary arteries with a very large left coronary system.  With injection into the  large left coronary circulation, there is brisk filling of a vigorously contracting left ventricle;  EF estimate 70 to 75%.  There does not appear to be any obvious fistula, and the appearance is more suggestive of brisk microvascular circulation.   Hypertension throughout the procedure.  The patient received hydralazine 10 mg IV at the completion of the procedure.   RECOMMENDATION: The patient will follow up with Dr. Tomie Chinaevankar.  She has been on metoprolol succinate for hypertension.  Additional therapy may be necessary if blood pressure remains elevated.     cMRI 09/14/20 1.  Normal LV size and systolic function (EF 68%). Minimal LGE at RV insertion. ECV 32% 2. Asymmetric LV hypertrophy measuring up to 14mm in  basal anterior wall (8mm in posterior wall), which does not meet criteria for hypertrophic cardiomyopathy (<52mm) 4.  Severe RV dilatation with normal systolic function (EF 53%) 5. Moderate to severe tricuspid regurgitation. 6. Moderate pericardial effusion measuring up to 11mm adjacent to LV inferior wall 7. Moderate LAE/RAE    Recent Labs: 10/20/2020: ALT 34; BUN 18; Creatinine, Ser 1.13; Platelets 207; TSH 1.088 10/23/2020: Hemoglobin 17.0; Potassium 4.2; Sodium 137  Recent Lipid Panel No results found for: CHOL, TRIG, HDL, CHOLHDL, VLDL, LDLCALC, LDLDIRECT  Physical Exam:    VS:  BP 124/70 (BP Location: Left Arm, Patient Position: Sitting, Cuff Size: Normal)   Pulse 78   Ht  (1.651 m)   Wt 138 lb (62.6 kg)   SpO2 98%   BMI 22.96 kg/m     Wt Readings from Last 3 Encounters:  10/28/20 138 lb (62.6 kg)  10/23/20 138 lb (62.6 kg)  10/20/20 138 lb 3.2 oz (62.7 kg)     GEN: Well nourished, well developed in no acute distress HEENT: Normal NECK: No JVD; No carotid bruits LYMPHATICS: No lymphadenopathy CARDIAC: S1S2 noted,RRR, no murmurs, rubs, gallops RESPIRATORY:  Clear to auscultation without rales, wheezing or rhonchi  ABDOMEN: Soft, non-tender, non-distended,  +bowel sounds, no guarding. EXTREMITIES: No edema, No cyanosis, no clubbing MUSCULOSKELETAL:  No deformity  SKIN: Warm and dry NEUROLOGIC:  Alert and oriented x 3, non-focal PSYCHIATRIC:  Normal affect, good insight  ASSESSMENT:    1. SOB (shortness of breath)   2. Hypertension, benign   3. Mild pulmonary hypertension (HCC)   4. Other cirrhosis of liver (HCC)   5. Other fatigue   6. PAF (paroxysmal atrial fibrillation) (HCC)    PLAN:    Patient did have a lot of questions today about all of the current testing that she has done so far as well as her current medication regimen.  I was able to answer these questions to her satisfaction.  Her shortness of breath is the most debilitating symptoms of all for her.    She is very appreciative with the care she is receiving especially with her follow-up at the heart failure clinic.   For today's visit I am going to continue her current medication regimen.   I am suspecting that her shortness of breath may also be multifactorial and she has not had any pulmonary function tests.  We will order pulmonary function testing the patient first to have this done at [facility].  She will still need her VQ scan I will defer to our heart failure team as well.  She has a diagnosis of paroxysmal atrial fibrillation and has not been on anticoagulation.  Recently ZIO monitor was placed on her by her heart failure team which is really good as we will be able to understand A. fib burden and the need for starting the patient on anticoagulation.  She notes fatigue sleep study is pending.  The patient is in agreement with the above plan. The patient left the office in stable condition.  The patient will follow up in 10 weeks   Medication Adjustments/Labs and Tests Ordered: Current medicines are reviewed at length with the patient today.  Concerns regarding medicines are outlined above.  Orders Placed This Encounter  Procedures   Pulmonary function test    No orders of the defined types were placed in this encounter.   Patient Instructions  Medication Instructions:  Your physician recommends that you continue on your current medications as directed. Please refer  to the Current Medication list given to you today.  *If you need a refill on your cardiac medications before your next appointment, please call your pharmacy*   Lab Work: None If you have labs (blood work) drawn today and your tests are completely normal, you will receive your results only by: MyChart Message (if you have MyChart) OR A paper copy in the mail If you have any lab test that is abnormal or we need to change your treatment, we will call you to review the results.   Testing/Procedures: Your physician has recommended that you have a pulmonary function test. Pulmonary Function Tests are a group of tests that measure how well air moves in and out of your lungs.   Follow-Up: At [facility], you and your health needs are our priority.  As part of our continuing mission to provide you with exceptional heart care, we have created designated Provider Care Teams.  These Care Teams include your primary Cardiologist (physician) and Advanced Practice Providers (APPs -  Physician Assistants and Nurse Practitioners) who all work together to provide you with the care you need, when you need it.  We recommend signing up for the patient portal called "MyChart".  Sign up information is provided on this After Visit Summary.  MyChart is used to connect with patients for Virtual Visits (Telemedicine).  Patients are able to view lab/test results, encounter notes, upcoming appointments, etc.  Non-urgent messages can be sent to your provider as well.   To learn more about what you can do with MyChart, go to [email or web address].    Your next appointment:   10 week(s)  The format for your next appointment:   In Person   Other Instructions Pulmonary Function Tests Pulmonary  function tests (PFTs) are breathing tests that are used to measure how well your lungs work, find out what is causing your lung problems, and figure out the best treatment for you. You may have PFTs: When you have an illness involving your lungs. To watch for changes in your lung function over time if you have a long-term (chronic) lung disease. If you are an IT trainer. PFTs check the effects of being exposed to chemicals over a long period of time. To check for diseases that affect the lungs, such as asthma or chronic obstructive pulmonary disease (COPD). To check lung function before having surgery or other procedures. To check your lungs, if you smoke. To check if prescribed medicines or treatments are helping your lungs. Your results will be compared with the expected lung function of someone with healthy lungs who is similar to you in several ways. These include age, sex, height, weight, and race or ethnicity. This is done to show how your lung function compares with normal lung function (percent predicted). The percent predicted helps your health care provider to know if your lung function is normal or not. If you have had PFTs done before, your health care provider will compare your current results with past results. This shows ifyour lung function is better, worse, or the same as before. Tell a health care provider about: Any allergies you have. All medicines you are taking, including inhaler or nebulizer medicines, vitamins, herbs, eye drops, creams, and over-the-counter medicines. Any blood disorders you have. Any surgeries you have had, especially recent surgery of the eye, abdomen, or chest. These can make PFTs difficult or unsafe. Any medical conditions you have, including chest pain or heart problems, tuberculosis, or respiratory infections such as pneumonia, a  cold, or the flu. Any fear of being in closed spaces (claustrophobia). Some of your tests may be in a closed  space. What are the risks? Generally, this is a safe procedure. However, problems may occur, including: Feeling light-headed due to fast, deep breathing known as over-breathing or hyperventilation. An asthma attack from deep breathing. What happens before the procedure? Take over-the-counter and prescription medicines only as told by your health care provider. If you take inhaler or nebulizer medicines, ask your health care provider which medicines you should take on the day of your testing. Some inhaler medicines may interfere with PFTs if they are taken shortly before the tests. Follow instructions from your health care provider about eating or drinking restrictions. These may include avoiding eating large meals and avoiding using caffeine before the testing. Do not drink alcohol for up to 4 hours before the test. Do not smoke for up to 4 hours before your procedure. This includes e-cigarettes. Wear comfortable clothing that will not interfere with breathing. Avoid exercise that takes a lot of effort (strenuous exercise) for at least 30 minutes before the testing. What happens during the procedure?  You will be given a soft nose clip to wear. This is done so all of your breaths will go through your mouth instead of your nose. You will be given a germ-free (sterile) mouthpiece. It will be attached to a machine, called a spirometer, that measures your breathing. You will be asked to do various breathing exercises. The exercises will be done by breathing in (inhaling) and breathing out (exhaling). You may be asked to repeat the exercises several times before the testing is complete. It is important to follow the instructions exactly to get accurate results. Make sure to blow as hard and as fast as you can when you are told to do so. You may be given a medicine called a bronchodilator that makes the small air passages in your lungs larger. This medicine will make it easier for you to breathe. The  tests will then be repeated after the medicine takes effect. You will be watched carefully during the procedure for any problems, such as feeling faint or dizzy, or having trouble breathing. The procedure may vary among health care providers and hospitals. What can I expect after the test procedure? It is up to you to get the results of your procedure. Ask your health care provider, or the department that is doing the procedure, when your results will be ready. After you have received your results, talk with your health careprovider about treatment options, if necessary. Follow these instructions at home: Do not use any products that contain nicotine or tobacco, such as cigarettes, e-cigarettes, and chewing tobacco. If you need help quitting, ask your healthcare provider. Summary Pulmonary function tests (PFTs) are used to measure how well your lungs work, find out what is causing your lung problems, and figure out the best treatment for you. If you have had PFTs done before, your health care provider will compare your current results with past results. This shows if your lung function is better, worse, or the same as before. Wear comfortable clothing that will not interfere with breathing when you are tested. It is up to you to get the results of your procedure. After you have received them, talk with your health care provider about treatment options, if necessary. This information is not intended to replace advice given to you by your health care provider. Make sure you discuss any questions you have  with your healthcare provider. Document Revised: 04/09/2019 Document Reviewed: 04/09/2019 Elsevier Patient Education  2022 Elsevier Inc.    Adopting a Healthy Lifestyle.  Know what a healthy weight is for you (roughly BMI <25) and aim to maintain this   Aim for 7+ servings of fruits and vegetables daily   65-80+ fluid ounces of water or unsweet tea for healthy kidneys   Limit to max 1 drink  of alcohol per day; avoid smoking/tobacco   Limit animal fats in diet for cholesterol and heart health - choose grass fed whenever available   Avoid highly processed foods, and foods high in saturated/trans fats   Aim for low stress - take time to unwind and care for your mental health   Aim for 150 min of moderate intensity exercise weekly for heart health, and weights twice weekly for bone health   Aim for 7-9 hours of sleep daily   When it comes to diets, agreement about the perfect plan isnt easy to find, even among the experts. Experts at the [facility] [employer] developed an idea known as the Healthy Eating Plate. Just imagine a plate divided into logical, healthy portions.   The emphasis is on diet quality:   Load up on vegetables and fruits - one-half of your plate: Aim for color and variety, and remember that potatoes dont count.   Go for whole grains - one-quarter of your plate: Whole wheat, barley, wheat berries, quinoa, oats, brown rice, and foods made with them. If you want pasta, go with whole wheat pasta.   Protein power - one-quarter of your plate: Fish, chicken, beans, and nuts are all healthy, versatile protein sources. Limit red meat.   The diet, however, does go beyond the plate, offering a few other suggestions.   Use healthy plant oils, such as olive, canola, soy, corn, sunflower and peanut. Check the labels, and avoid partially hydrogenated oil, which have unhealthy trans fats.   If youre thirsty, drink water. Coffee and tea are good in moderation, but skip sugary drinks and limit milk and dairy products to one or two daily servings.   The type of carbohydrate in the diet is more important than the amount. Some sources of carbohydrates, such as vegetables, fruits, whole grains, and beans-are healthier than others.   Finally, stay active  Signed, Thomasene Ripple, DO  10/29/2020 12:22 PM    [facility]

## 2020-12-25 ENCOUNTER — Other Ambulatory Visit (HOSPITAL_COMMUNITY): Payer: Self-pay | Admitting: Emergency Medicine

## 2020-12-25 ENCOUNTER — Encounter (HOSPITAL_COMMUNITY): Payer: Self-pay | Admitting: Internal Medicine

## 2020-12-25 ENCOUNTER — Ambulatory Visit (HOSPITAL_COMMUNITY)
Admission: RE | Admit: 2020-12-25 | Discharge: 2020-12-25 | Disposition: A | Discharge status: Home / Self Care | Payer: PRIVATE HEALTH INSURANCE | Source: Ambulatory Visit | Attending: Internal Medicine | Admitting: Internal Medicine

## 2020-12-25 ENCOUNTER — Other Ambulatory Visit (HOSPITAL_COMMUNITY): Payer: Self-pay | Admitting: *Deleted

## 2020-12-25 ENCOUNTER — Other Ambulatory Visit: Payer: Self-pay

## 2020-12-25 VITALS — BP 120/80 | HR 73 | Wt 133.8 lb

## 2020-12-25 DIAGNOSIS — K7460 Unspecified cirrhosis of liver: Secondary | ICD-10-CM | POA: Insufficient documentation

## 2020-12-25 DIAGNOSIS — Q2112 Patent foramen ovale: Secondary | ICD-10-CM

## 2020-12-25 DIAGNOSIS — I272 Pulmonary hypertension, unspecified: Secondary | ICD-10-CM | POA: Diagnosis present

## 2020-12-25 DIAGNOSIS — I5032 Chronic diastolic (congestive) heart failure: Secondary | ICD-10-CM | POA: Diagnosis not present

## 2020-12-25 DIAGNOSIS — K7469 Other cirrhosis of liver: Secondary | ICD-10-CM

## 2020-12-25 DIAGNOSIS — Z79899 Other long term (current) drug therapy: Secondary | ICD-10-CM | POA: Insufficient documentation

## 2020-12-25 DIAGNOSIS — Q211 Atrial septal defect: Secondary | ICD-10-CM

## 2020-12-25 DIAGNOSIS — Z885 Allergy status to narcotic agent status: Secondary | ICD-10-CM | POA: Insufficient documentation

## 2020-12-25 DIAGNOSIS — Z8616 Personal history of COVID-19: Secondary | ICD-10-CM | POA: Diagnosis not present

## 2020-12-25 DIAGNOSIS — I119 Hypertensive heart disease without heart failure: Secondary | ICD-10-CM | POA: Insufficient documentation

## 2020-12-25 DIAGNOSIS — I480 Paroxysmal atrial fibrillation: Secondary | ICD-10-CM | POA: Diagnosis not present

## 2020-12-25 DIAGNOSIS — Z8249 Family history of ischemic heart disease and other diseases of the circulatory system: Secondary | ICD-10-CM | POA: Diagnosis not present

## 2020-12-25 DIAGNOSIS — I313 Pericardial effusion (noninflammatory): Secondary | ICD-10-CM | POA: Insufficient documentation

## 2020-12-25 DIAGNOSIS — Z791 Long term (current) use of non-steroidal anti-inflammatories (NSAID): Secondary | ICD-10-CM | POA: Diagnosis not present

## 2020-12-25 DIAGNOSIS — R002 Palpitations: Secondary | ICD-10-CM | POA: Diagnosis not present

## 2020-12-25 LAB — CBC
HCT: 51.9 % — ABNORMAL HIGH (ref 36.0–46.0)
Hemoglobin: 16.6 g/dL — ABNORMAL HIGH (ref 12.0–15.0)
MCH: 33.9 pg (ref 26.0–34.0)
MCHC: 32 g/dL (ref 30.0–36.0)
MCV: 105.9 fL — ABNORMAL HIGH (ref 80.0–100.0)
Platelets: 168 10*3/uL (ref 150–400)
RBC: 4.9 MIL/uL (ref 3.87–5.11)
RDW: 13.5 % (ref 11.5–15.5)
WBC: 7.9 10*3/uL (ref 4.0–10.5)
nRBC: 0 % (ref 0.0–0.2)

## 2020-12-25 LAB — COMPREHENSIVE METABOLIC PANEL
ALT: 17 U/L (ref 0–44)
AST: 23 U/L (ref 15–41)
Albumin: 3.5 g/dL (ref 3.5–5.0)
Alkaline Phosphatase: 36 U/L — ABNORMAL LOW (ref 38–126)
Anion gap: 8 (ref 5–15)
BUN: 14 mg/dL (ref 6–20)
CO2: 26 mmol/L (ref 22–32)
Calcium: 9.5 mg/dL (ref 8.9–10.3)
Chloride: 103 mmol/L (ref 98–111)
Creatinine, Ser: 0.87 mg/dL (ref 0.44–1.00)
GFR, Estimated: >60 mL/min (ref >60)
Glucose, Bld: 130 mg/dL — ABNORMAL HIGH (ref 70–99)
Potassium: 4.7 mmol/L (ref 3.5–5.1)
Sodium: 137 mmol/L (ref 135–145)
Total Bilirubin: 1 mg/dL (ref 0.3–1.2)
Total Protein: 7.3 g/dL (ref 6.5–8.1)

## 2020-12-25 LAB — BRAIN NATRIURETIC PEPTIDE: B Natriuretic Peptide: 143.9 pg/mL — ABNORMAL HIGH (ref 0.0–100.0)

## 2020-12-25 NOTE — Progress Notes (Signed)
[facility]  NOTE  Referring Physician: Dr. Tomie China Primary Care: Simone Curia, MD Primary Cardiologist: Dr. 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 Tomie China for further evaluation of pulmonary HTN.   Had asymptomatic COVID in 2020.   Felt well and was working until 4/22. Then developed severe CP, SOB and LE edema. Was diuresed and referred to Dr. Tomie China. Subsequently referred for LHC.    LHC 4/22  There is hyperdynamic left ventricular systolic function.  Normal coronary arteries LV end diastolic pressure is moderately elevated. BP 162/94 (124) LV 169/27 The left ventricular ejection fraction is greater than 65% by visual estimate.   Echo 08/24/20: LVEF 70-75% + LVH G2 DD. RV dilated with normal function. RVSP . Massive biatrial enlargement   cMRI 09/14/20 1.  Normal LV size and systolic function (EF 68%). Minimal LGE at RV insertion. ECV 32% 2. Asymmetric LV hypertrophy measuring up to 110mm in basal anterior wall (62mm in posterior wall), which does not meet criteria for hypertrophic cardiomyopathy (<41mm) 4.  Severe RV dilatation with normal systolic function (EF 53%) 5. Moderate to severe tricuspid regurgitation.  6. Moderate pericardial effusion measuring up to 41mm adjacent to LV inferior wall 7. Moderate LAE/RAE  FHx notable for HTN and DM2 Non-smoke. No asthma. No family h/o CTD.   Initial consult 10/20/20 spiro decreased to 12.5 due to low bp/recent bmp.  Rheum labs and sleep study ordered. Asa stopped and zio patch placed for palpitations and hx of afib not on AC.  Scheduled for   RHC 10/23/2020:  RA = 6 RV = 38/8 PA = 40/17 (24) PCW = 19 Fick cardiac output/index = 7.0/4.1 PVR = 2.0 WU Ao sat = 99% High SVC sat = 77% High RA = 82% Low RA = 91% RV = 89% PA = 87%, 88% Qp/Qs = 1.09   Assessment: 1. 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  Zio monitor results (14 day) 11/09/20: short runs of nsvt(5 events) and svt (20 events), no afib detected.    Here for FU visit today has been feeling well.  Some mild shortness of breath with sweeping and vacuuming or climbing up hills or stairs.  Improves quickly with rest.  No shortness of breath just walking on flat ground.  Denies chest pain, orthopnea, dizziness or light headedness.  BP at home has been 120's systolic/70's diastolic.  Making changes to her diet has lost 5lbs.     Past Medical History:  Diagnosis Date   Abnormal liver enzymes    Allergic rhinitis    Angina pectoris (HCC) 07/30/2020   Anxiety    Depression    DOE (dyspnea on exertion) 07/30/2020   Dyspnea    Hyperglycemia    Hypertension    Hypertension, benign    Menopause    Migraine    Osteoarthritis    Paroxysmal atrial fibrillation (HCC)     Current Outpatient Medications  Medication Sig Dispense Refill   acetaminophen (TYLENOL) 325 MG tablet Take 650 mg by mouth every 6 (six) hours as needed for moderate pain.     ALPRAZolam (XANAX) 0.25 MG tablet Take 0.25 mg by mouth every 8 (eight) hours as needed for anxiety.     estradiol (ESTRACE) 2 MG tablet Take 2 mg by mouth daily.  loratadine (CLARITIN) 10 MG tablet Take 10 mg by mouth daily as needed for allergies.     meloxicam (MOBIC) 15 MG tablet Take 15 mg by mouth daily as needed for pain.     Menthol-Methyl Salicylate (BEN GAY GREASELESS) 10-15 % greaseless cream Apply 1 application topically daily as needed for pain.     metoprolol succinate (TOPROL-XL) 25 MG 24 hr tablet Take 2 tablets (50 mg total) by mouth daily. Take with or immediately following a meal. 90 tablet 3   nitroGLYCERIN (NITROSTAT) 0.4 MG SL tablet Place 0.4 mg under the tongue every 5 (five) minutes as needed for chest pain.     sennosides-docusate sodium (SENOKOT-S) 8.6-50 MG tablet Take 2  tablets by mouth daily.     spironolactone (ALDACTONE) 25 MG tablet Take 0.5 tablets (12.5 mg total) by mouth daily. 15 tablet    SUMAtriptan (IMITREX) 100 MG tablet Take 100 mg by mouth every 2 (two) hours as needed for migraine. May repeat in 2 hours if headache persists or recurs.     No current facility-administered medications for this encounter.    Allergies  Allergen Reactions   Hydrocodone-Acetaminophen     Vomiting   Codeine Nausea And Vomiting   Morphine And Related Nausea Only      Social History   Socioeconomic History   Marital status: Single    Spouse name: Not on file   Number of children: Not on file   Years of education: Not on file   Highest education level: Not on file  Occupational History   Not on file  Tobacco Use   Smoking status: Never   Smokeless tobacco: Never  Substance and Sexual Activity   Alcohol use: Never   Drug use: Never   Sexual activity: Not on file  Other Topics Concern   Not on file  Social History Narrative   Not on file   Social Determinants of Health   Financial Resource Strain: Not on file  Food Insecurity: Not on file  Transportation Needs: Not on file  Physical Activity: Not on file  Stress: Not on file  Social Connections: Not on file  Intimate Partner Violence: Not on file      Family History  Problem Relation Age of Onset   Hypertension Mother    Hypertension Father     Vitals:   12/25/20 0942  BP: 120/80  Pulse: 73  SpO2: 100%  Weight: 60.7 kg (133 lb 12.8 oz)     PHYSICAL EXAM: General:  Well appearing. No respiratory difficulty HEENT: normal Neck: supple. no JVD. Carotids 2+ bilat; no bruits. No lymphadenopathy or thryomegaly appreciated. Cor: PMI nondisplaced. Regular rate & rhythm. No rubs, gallops or murmurs. Lungs: clear Abdomen: soft, nontender, nondistended. No hepatosplenomegaly. No bruits or masses. Good bowel sounds. Extremities: no cyanosis, clubbing, rash, edema Neuro: alert &  oriented x 3, cranial nerves grossly intact. moves all 4 extremities w/o difficulty. Affect pleasant.  ECG:  NSR rate 72, biatrial enlargement  ASSESSMENT & PLAN:  Mild Pulmonary HTN - based on RHC likely primarily 2/2 small ASD  - autoimmune labs not consistent with rheumatologic disease - sleep study pending - off farxiga due to cost - arrange for TEE and CT scan to evaluate for L>R shunt - Can consider PFTs and VQ down the road but doubt these will provide much more info  2. Cirrhosis - suspect due to high righ-sided pressures.  - follows with GI  3. Palpitations - likely due to  short runs of SVT - has improved significantly, no longer having symptoms  4. HTN - BP today better continue 12.5mg  dose of spiro, Toprol XL 50  5. Snoring/fatigue - sleep study pending  6. PAF - zio ordered to assess and determine need for [facility].  No afib on zio monitor 10/2020  Angelita Ingles, MD  9:54 AM  Patient seen and examined with the above-signed Advanced Practice Provider and/or Housestaff. I personally reviewed laboratory data, imaging studies and relevant notes. I independently examined the patient and formulated the important aspects of the plan. I have edited the note to reflect any of my changes or salient points. I have personally discussed the plan with the patient and/or family.  Feeling better. Less dyspneic. No orthopnea or PND  General:  Well appearing. No resp difficulty HEENT: normal Neck: supple. no JVD. Carotids 2+ bilat; no bruits. No lymphadenopathy or thryomegaly appreciated. Cor: PMI nondisplaced. Regular rate & rhythm. No rubs, gallops or murmurs. Lungs: clear Abdomen: soft, nontender, nondistended. No hepatosplenomegaly. No bruits or masses. Good bowel sounds. Extremities: no cyanosis, clubbing, rash, edema Neuro: alert & orientedx3, cranial nerves grossly intact. moves all 4 extremities w/o difficulty. Affect pleasant  We reviewed results of RHC in detail.  Appears to have mild L->R shunt at [facility] or RA. Will plan TEE and cardiac CT to further evaluate. Given cirrhosis may also have peripheral shunting/varices contributing to high output. Will get ab CT as well.  Zio reviewed. No AF.   Arvilla Meres, MD  1:28 PM

## 2020-12-25 NOTE — H&P (View-Only) (Signed)
[facility]IN[facility]  NOTE  Referring Physician: Dr. Tomie China Primary Care: Simone Curia, MD Primary Cardiologist: Dr. 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 Tomie China for further evaluation of pulmonary HTN.   Had asymptomatic COVID in 2020.   Felt well and was working until 4/22. Then developed severe CP, SOB and LE edema. Was diuresed and referred to Dr. Tomie China. Subsequently referred for LHC.    LHC 4/22  There is hyperdynamic left ventricular systolic function.  Normal coronary arteries LV end diastolic pressure is moderately elevated. BP 162/94 (124) LV 169/27 The left ventricular ejection fraction is greater than 65% by visual estimate.   Echo 08/24/20: LVEF 70-75% + LVH G2 DD. RV dilated with normal function. RVSP . Massive biatrial enlargement   cMRI 09/14/20 1.  Normal LV size and systolic function (EF 68%). Minimal LGE at RV insertion. ECV 32% 2. Asymmetric LV hypertrophy measuring up to 110mm in basal anterior wall (62mm in posterior wall), which does not meet criteria for hypertrophic cardiomyopathy (<41mm) 4.  Severe RV dilatation with normal systolic function (EF 53%) 5. Moderate to severe tricuspid regurgitation.  6. Moderate pericardial effusion measuring up to 41mm adjacent to LV inferior wall 7. Moderate LAE/RAE  FHx notable for HTN and DM2 Non-smoke. No asthma. No family h/o CTD.   Initial consult 10/20/20 spiro decreased to 12.5 due to low bp/recent bmp.  Rheum labs and sleep study ordered. Asa stopped and zio patch placed for palpitations and hx of afib not on AC.  Scheduled for   RHC 10/23/2020:  RA = 6 RV = 38/8 PA = 40/17 (24) PCW = 19 Fick cardiac output/index = 7.0/4.1 PVR = 2.0 WU Ao sat = 99% High SVC sat = 77% High RA = 82% Low RA = 91% RV = 89% PA = 87%, 88% Qp/Qs = 1.09   Assessment: 1. 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  Zio monitor results (14 day) 11/09/20: short runs of nsvt(5 events) and svt (20 events), no afib detected.    Here for FU visit today has been feeling well.  Some mild shortness of breath with sweeping and vacuuming or climbing up hills or stairs.  Improves quickly with rest.  No shortness of breath just walking on flat ground.  Denies chest pain, orthopnea, dizziness or light headedness.  BP at home has been 120's systolic/70's diastolic.  Making changes to her diet has lost 5lbs.     Past Medical History:  Diagnosis Date   Abnormal liver enzymes    Allergic rhinitis    Angina pectoris (HCC) 07/30/2020   Anxiety    Depression    DOE (dyspnea on exertion) 07/30/2020   Dyspnea    Hyperglycemia    Hypertension    Hypertension, benign    Menopause    Migraine    Osteoarthritis    Paroxysmal atrial fibrillation (HCC)     Current Outpatient Medications  Medication Sig Dispense Refill   acetaminophen (TYLENOL) 325 MG tablet Take 650 mg by mouth every 6 (six) hours as needed for moderate pain.     ALPRAZolam (XANAX) 0.25 MG tablet Take 0.25 mg by mouth every 8 (eight) hours as needed for anxiety.     estradiol (ESTRACE) 2 MG tablet Take 2 mg by mouth daily.  loratadine (CLARITIN) 10 MG tablet Take 10 mg by mouth daily as needed for allergies.     meloxicam (MOBIC) 15 MG tablet Take 15 mg by mouth daily as needed for pain.     Menthol-Methyl Salicylate (BEN GAY GREASELESS) 10-15 % greaseless cream Apply 1 application topically daily as needed for pain.     metoprolol succinate (TOPROL-XL) 25 MG 24 hr tablet Take 2 tablets (50 mg total) by mouth daily. Take with or immediately following a meal. 90 tablet 3   nitroGLYCERIN (NITROSTAT) 0.4 MG SL tablet Place 0.4 mg under the tongue every 5 (five) minutes as needed for chest pain.     sennosides-docusate sodium (SENOKOT-S) 8.6-50 MG tablet Take 2  tablets by mouth daily.     spironolactone (ALDACTONE) 25 MG tablet Take 0.5 tablets (12.5 mg total) by mouth daily. 15 tablet    SUMAtriptan (IMITREX) 100 MG tablet Take 100 mg by mouth every 2 (two) hours as needed for migraine. May repeat in 2 hours if headache persists or recurs.     No current facility-administered medications for this encounter.    Allergies  Allergen Reactions   Hydrocodone-Acetaminophen     Vomiting   Codeine Nausea And Vomiting   Morphine And Related Nausea Only      Social History   Socioeconomic History   Marital status: Single    Spouse name: Not on file   Number of children: Not on file   Years of education: Not on file   Highest education level: Not on file  Occupational History   Not on file  Tobacco Use   Smoking status: Never   Smokeless tobacco: Never  Substance and Sexual Activity   Alcohol use: Never   Drug use: Never   Sexual activity: Not on file  Other Topics Concern   Not on file  Social History Narrative   Not on file   Social Determinants of Health   Financial Resource Strain: Not on file  Food Insecurity: Not on file  Transportation Needs: Not on file  Physical Activity: Not on file  Stress: Not on file  Social Connections: Not on file  Intimate Partner Violence: Not on file      Family History  Problem Relation Age of Onset   Hypertension Mother    Hypertension Father     Vitals:   12/25/20 0942  BP: 120/80  Pulse: 73  SpO2: 100%  Weight: 60.7 kg (133 lb 12.8 oz)     PHYSICAL EXAM: General:  Well appearing. No respiratory difficulty HEENT: normal Neck: supple. no JVD. Carotids 2+ bilat; no bruits. No lymphadenopathy or thryomegaly appreciated. Cor: PMI nondisplaced. Regular rate & rhythm. No rubs, gallops or murmurs. Lungs: clear Abdomen: soft, nontender, nondistended. No hepatosplenomegaly. No bruits or masses. Good bowel sounds. Extremities: no cyanosis, clubbing, rash, edema Neuro: alert &  oriented x 3, cranial nerves grossly intact. moves all 4 extremities w/o difficulty. Affect pleasant.  ECG:  NSR rate 72, biatrial enlargement  ASSESSMENT & PLAN:  Mild Pulmonary HTN - based on RHC likely primarily 2/2 small ASD  - autoimmune labs not consistent with rheumatologic disease - sleep study pending - off farxiga due to cost - arrange for TEE and CT scan to evaluate for L>R shunt - Can consider PFTs and VQ down the road but doubt these will provide much more info  2. Cirrhosis - suspect due to high righ-sided pressures.  - follows with GI  3. Palpitations - likely due to  short runs of SVT - has improved significantly, no longer having symptoms  4. HTN - BP today better continue 12.5mg  dose of spiro, Toprol XL 50  5. Snoring/fatigue - sleep study pending  6. PAF - zio ordered to assess and determine need for [facility].  No afib on zio monitor 10/2020  Angelita Ingles, MD  9:54 AM  Patient seen and examined with the above-signed Advanced Practice Provider and/or Housestaff. I personally reviewed laboratory data, imaging studies and relevant notes. I independently examined the patient and formulated the important aspects of the plan. I have edited the note to reflect any of my changes or salient points. I have personally discussed the plan with the patient and/or family.  Feeling better. Less dyspneic. No orthopnea or PND  General:  Well appearing. No resp difficulty HEENT: normal Neck: supple. no JVD. Carotids 2+ bilat; no bruits. No lymphadenopathy or thryomegaly appreciated. Cor: PMI nondisplaced. Regular rate & rhythm. No rubs, gallops or murmurs. Lungs: clear Abdomen: soft, nontender, nondistended. No hepatosplenomegaly. No bruits or masses. Good bowel sounds. Extremities: no cyanosis, clubbing, rash, edema Neuro: alert & orientedx3, cranial nerves grossly intact. moves all 4 extremities w/o difficulty. Affect pleasant  We reviewed results of RHC in detail.  Appears to have mild L->R shunt at [facility] or RA. Will plan TEE and cardiac CT to further evaluate. Given cirrhosis may also have peripheral shunting/varices contributing to high output. Will get ab CT as well.  Zio reviewed. No AF.   Arvilla Meres, MD  1:28 PM

## 2020-12-25 NOTE — Patient Instructions (Addendum)
Your physician has requested that you have a TEE. During a TEE, sound waves are used to create images of your heart. It provides your doctor with information about the size and shape of your heart and how well your heart's chambers and valves are working. In this test, a transducer is attached to the end of a flexible tube that's guided down your throat and into your esophagus (the tube leading from you mouth to your stomach) to get a more detailed image of your heart. You are not awake for the procedure. Please see the instruction sheet given to you today. For further information please visit [email or web address] SEE INSTRUCTIONS BELOW  Your physician has requested that you have cardiac CT. Cardiac computed tomography (CT) is a painless test that uses an x-ray machine to take clear, detailed pictures of your heart. For further information please visit [email or web address] Please follow instruction sheet as given. SEE INSTRUCTIONS BELOW  Your physician recommends that you schedule a follow-up appointment in: 3-4 months  If you have any questions or concerns before your next appointment please send [place] a message through [place] or call our office at [phone].    TO LEAVE A MESSAGE FOR THE NURSE SELECT OPTION 2, PLEASE LEAVE A MESSAGE INCLUDING: YOUR NAME DATE OF BIRTH CALL BACK NUMBER REASON FOR CALL**this is important as we prioritize the call backs  YOU WILL RECEIVE A CALL BACK THE SAME DAY AS LONG AS YOU CALL BEFORE 4:00 PM  At the [facility], you and your health needs are our priority. As part of our continuing mission to provide you with exceptional heart care, we have created designated Provider Care Teams. These Care Teams include your primary Cardiologist (physician) and Advanced Practice Providers (APPs- Physician Assistants and Nurse Practitioners) who all work together to provide you with the care you need, when you need it.   You may see any of the following  providers on your designated Care Team at your next follow up: Dr Arvilla Meres Dr Marca Ancona Dr Brandon Melnick, NP Robbie Lis, [place] Mikki Santee Karle Plumber, PharmD   Please be sure to bring in all your medications bottles to every appointment.      Your cardiac CT will be scheduled at:   [facility] [address] [place], [place] [address] [phone]   Please arrive at the [facility] main entrance (entrance A) of [facility] 30 minutes prior to test start time. Proceed to the [facility] Radiology Department (first floor) to check-in and test prep.  Please follow these instructions carefully (unless otherwise directed):  Hold all erectile dysfunction medications at least 3 days (72 hrs) prior to test.  On the Night Before the Test: Be sure to Drink plenty of water. Do not consume any caffeinated/decaffeinated beverages or chocolate 12 hours prior to your test. Do not take any antihistamines 12 hours prior to your test. If the patient has contrast allergy: Patient will need a prescription for Prednisone and very clear instructions (as follows): Prednisone 50 mg - take 13 hours prior to test Take another Prednisone 50 mg 7 hours prior to test Take another Prednisone 50 mg 1 hour prior to test Take Benadryl 50 mg 1 hour prior to test Patient must complete all four doses of above prophylactic medications. Patient will need a ride after test due to Benadryl.  On the Day of the Test: Drink plenty of water until 1 hour prior to the test. Do not  eat any food 4 hours prior to the test. You may take your regular medications prior to the test.  Take metoprolol (Lopressor) two hours prior to test. HOLD Furosemide/Hydrochlorothiazide morning of the test. FEMALES- please wear underwire-free bra if available, avoid dresses & tight clothing   *For Clinical Staff only. Please instruct patient the following:* Heart Rate  Medication Recommendations for Cardiac CT  Resting HR < 50 bpm  No medication  Resting HR 50-60 bpm and BP >110/50 mmHG   Consider Metoprolol tartrate 25 mg PO 90-120 min prior to scan  Resting HR 60-65 bpm and BP >110/50 mmHG  Metoprolol tartrate 50 mg PO 90-120 minutes prior to scan   Resting HR > 65 bpm and BP >110/50 mmHG  Metoprolol tartrate 100 mg PO 90-120 minutes prior to scan  Consider Ivabradine 10-15 mg PO or a calcium channel blocker for resting HR >60 bpm and contraindication to metoprolol tartrate  Consider Ivabradine 10-15 mg PO in combination with metoprolol tartrate for HR >80 bpm         After the Test: Drink plenty of water. After receiving IV contrast, you may experience a mild flushed feeling. This is normal. On occasion, you may experience a mild rash up to 24 hours after the test. This is not dangerous. If this occurs, you can take Benadryl 25 mg and increase your fluid intake. If you experience trouble breathing, this can be serious. If it is severe call 911 IMMEDIATELY. If it is mild, please call our office. If you take any of these medications: Glipizide/Metformin, Avandament, Glucavance, please do not take 48 hours after completing test unless otherwise instructed.  Please allow 2-4 weeks for scheduling of routine cardiac CTs. Some insurance companies require a pre-authorization which may delay scheduling of this test.   For non-scheduling related questions, please contact the cardiac imaging nurse navigator should you have any questions/concerns: Rockwell Alexandria, Cardiac Imaging Nurse Navigator Larey Brick, Cardiac Imaging Nurse Navigator [facility] Direct Office Dial: [phone]   For scheduling needs, including cancellations and rescheduling, please call [place], [phone].   TEE INSTRUCTIONS:  You are scheduled for a TEE on Monday 01/04/21 with Dr. Gala Romney.    Please arrive at the [facility] (Main Entrance A) at  [facility]: [address] [place], [place] [address] at 8:30 am.   DIET: Nothing to eat or drink after midnight except a sip of water with medications (see medication instructions below)  FYI: For your safety, and to allow [place] to monitor your vital signs accurately during the surgery/procedure we request that   if you have artificial nails, gel coating, SNS etc. Please have those removed prior to your surgery/procedure. Not having the nail coverings /polish removed may result in cancellation or delay of your surgery/procedure.   Medication Instructions: YOU MAY TAKE ALL OF YOUR MEDICATIONS WITH WATER AM OF TEST   Labs: DONE TODAY  You must have a responsible person to drive you home and stay in the waiting area during your procedure. Failure to do so could result in cancellation.  Bring your insurance cards.  *Special Note: Every effort is made to have your procedure done on time. Occasionally there are emergencies that occur at the hospital that may cause delays. Please be patient if a delay does occur.

## 2020-12-30 ENCOUNTER — Telehealth (HOSPITAL_COMMUNITY): Payer: Self-pay | Admitting: *Deleted

## 2020-12-30 ENCOUNTER — Telehealth (HOSPITAL_COMMUNITY): Payer: Self-pay | Admitting: Emergency Medicine

## 2020-12-30 NOTE — Telephone Encounter (Signed)
Returning patient's phone call regarding upcoming cardiac imaging study; pt verbalizes understanding of appt date/time, parking situation and where to check in, pre-test NPO status and verified current allergies; name and call back number provided for further questions should they arise  Larey Brick RN Navigator Cardiac Imaging [facility] [phone] office [phone] cell

## 2020-12-30 NOTE — Telephone Encounter (Signed)
Attempted to call patient regarding upcoming cardiac CT appointment. °Left message on voicemail with name and callback number °Ilanna Deihl RN Navigator Cardiac Imaging ° [facility] °[phone] Office °[phone] Cell ° °

## 2021-01-01 ENCOUNTER — Ambulatory Visit (HOSPITAL_COMMUNITY)
Admission: RE | Admit: 2021-01-01 | Discharge: 2021-01-01 | Disposition: A | Discharge status: Home / Self Care | Payer: PRIVATE HEALTH INSURANCE | Source: Ambulatory Visit | Attending: Internal Medicine | Admitting: Internal Medicine

## 2021-01-01 ENCOUNTER — Other Ambulatory Visit: Payer: Self-pay

## 2021-01-01 DIAGNOSIS — K7469 Other cirrhosis of liver: Secondary | ICD-10-CM | POA: Diagnosis not present

## 2021-01-01 DIAGNOSIS — Q2112 Patent foramen ovale: Secondary | ICD-10-CM

## 2021-01-01 DIAGNOSIS — I272 Pulmonary hypertension, unspecified: Secondary | ICD-10-CM | POA: Insufficient documentation

## 2021-01-01 DIAGNOSIS — Q211 Atrial septal defect: Secondary | ICD-10-CM | POA: Diagnosis present

## 2021-01-01 MED ORDER — IOHEXOL 350 MG/ML SOLN
95.0000 mL | Freq: Once | INTRAVENOUS | Status: AC | PRN
  Administered 2021-01-01: 95 mL via INTRAVENOUS

## 2021-01-04 ENCOUNTER — Ambulatory Visit (HOSPITAL_COMMUNITY)
Admission: RE | Admit: 2021-01-04 | Discharge: 2021-01-04 | Disposition: A | Discharge status: Home / Self Care | Payer: PRIVATE HEALTH INSURANCE | Attending: Internal Medicine | Admitting: Internal Medicine

## 2021-01-04 ENCOUNTER — Encounter (HOSPITAL_COMMUNITY)
Admission: RE | Disposition: A | Discharge status: Home / Self Care | Payer: Self-pay | Source: Home / Self Care | Attending: Internal Medicine

## 2021-01-04 ENCOUNTER — Other Ambulatory Visit: Payer: Self-pay

## 2021-01-04 ENCOUNTER — Encounter (HOSPITAL_COMMUNITY): Payer: Self-pay | Admitting: Internal Medicine

## 2021-01-04 ENCOUNTER — Ambulatory Visit (HOSPITAL_COMMUNITY): Payer: PRIVATE HEALTH INSURANCE | Admitting: Anesthesiology

## 2021-01-04 ENCOUNTER — Telehealth: Payer: Self-pay

## 2021-01-04 ENCOUNTER — Ambulatory Visit (HOSPITAL_COMMUNITY)
Admission: RE | Admit: 2021-01-04 | Discharge: 2021-01-04 | Disposition: A | Discharge status: Home / Self Care | Payer: PRIVATE HEALTH INSURANCE | Source: Ambulatory Visit | Attending: Internal Medicine | Admitting: Internal Medicine

## 2021-01-04 DIAGNOSIS — Q211 Atrial septal defect: Secondary | ICD-10-CM | POA: Insufficient documentation

## 2021-01-04 DIAGNOSIS — Z8249 Family history of ischemic heart disease and other diseases of the circulatory system: Secondary | ICD-10-CM | POA: Diagnosis not present

## 2021-01-04 DIAGNOSIS — I272 Pulmonary hypertension, unspecified: Secondary | ICD-10-CM | POA: Diagnosis not present

## 2021-01-04 DIAGNOSIS — Z885 Allergy status to narcotic agent status: Secondary | ICD-10-CM | POA: Insufficient documentation

## 2021-01-04 DIAGNOSIS — I10 Essential (primary) hypertension: Secondary | ICD-10-CM | POA: Diagnosis not present

## 2021-01-04 DIAGNOSIS — R002 Palpitations: Secondary | ICD-10-CM | POA: Insufficient documentation

## 2021-01-04 DIAGNOSIS — K7460 Unspecified cirrhosis of liver: Secondary | ICD-10-CM | POA: Insufficient documentation

## 2021-01-04 DIAGNOSIS — Z8616 Personal history of COVID-19: Secondary | ICD-10-CM | POA: Diagnosis not present

## 2021-01-04 DIAGNOSIS — R0683 Snoring: Secondary | ICD-10-CM | POA: Diagnosis not present

## 2021-01-04 DIAGNOSIS — Z79899 Other long term (current) drug therapy: Secondary | ICD-10-CM | POA: Insufficient documentation

## 2021-01-04 DIAGNOSIS — I480 Paroxysmal atrial fibrillation: Secondary | ICD-10-CM | POA: Insufficient documentation

## 2021-01-04 DIAGNOSIS — Q2112 Patent foramen ovale: Secondary | ICD-10-CM

## 2021-01-04 HISTORY — PX: TEE WITHOUT CARDIOVERSION: SHX5443

## 2021-01-04 SURGERY — ECHOCARDIOGRAM, TRANSESOPHAGEAL
Anesthesia: Monitor Anesthesia Care

## 2021-01-04 MED ORDER — BUTAMBEN-TETRACAINE-BENZOCAINE 2-2-14 % EX AERO
INHALATION_SPRAY | CUTANEOUS | Status: DC | PRN
  Administered 2021-01-04: 2 via TOPICAL

## 2021-01-04 MED ORDER — PROPOFOL 500 MG/50ML IV EMUL
INTRAVENOUS | Status: DC | PRN
  Administered 2021-01-04: 100 ug/kg/min via INTRAVENOUS

## 2021-01-04 MED ORDER — SODIUM CHLORIDE 0.9 % IV SOLN: INTRAVENOUS | Status: DC

## 2021-01-04 MED ORDER — PROPOFOL 10 MG/ML IV BOLUS
INTRAVENOUS | Status: DC | PRN
  Administered 2021-01-04 (×3): 20 mg via INTRAVENOUS

## 2021-01-04 NOTE — Telephone Encounter (Signed)
Per Dr. Gala Romney, scheduled the patient for ASD consult with Dr. Excell Seltzer on 01/07/2021. She was grateful for call and agrees with plan.

## 2021-01-04 NOTE — Transfer of Care (Signed)
Immediate Anesthesia Transfer of Care Note  Patient: Joanna Griffin  Procedure(s) Performed: TRANSESOPHAGEAL ECHOCARDIOGRAM (TEE)  Patient Location: Endoscopy Unit  Anesthesia Type:MAC  Level of Consciousness: awake, oriented and patient cooperative  Airway & Oxygen Therapy: Patient Spontanous Breathing  Post-op Assessment: Report given to RN and Post -op Vital signs reviewed and stable  Post vital signs: Reviewed  Last Vitals:  Vitals Value Taken Time  BP 115/53 01/04/21 1015  Temp    Pulse 78 01/04/21 1017  Resp 23 01/04/21 1017  SpO2 100 % 01/04/21 1017  Vitals shown include unvalidated device data.  Last Pain:  Vitals:   01/04/21 1016  TempSrc:   PainSc: 0-No pain         Complications: No notable events documented.

## 2021-01-04 NOTE — Progress Notes (Signed)
  Echocardiogram Echocardiogram Transesophageal has been performed.  Janalyn Harder 01/04/2021, 10:22 AM

## 2021-01-04 NOTE — Anesthesia Preprocedure Evaluation (Addendum)
Anesthesia Evaluation  Patient identified by MRN, date of birth, ID band Patient awake    Reviewed: Allergy & Precautions, NPO status , Patient's Chart, lab work & pertinent test results  Airway Mallampati: II  TM Distance: >3 FB     Dental  (+) Dental Advisory Given   Pulmonary shortness of breath,    breath sounds clear to auscultation       Cardiovascular hypertension, Pt. on medications + DOE  + dysrhythmias Atrial Fibrillation  Rhythm:Regular Rate:Normal     Neuro/Psych  Headaches,    GI/Hepatic negative GI ROS, Neg liver ROS,   Endo/Other  negative endocrine ROS  Renal/GU negative Renal ROS     Musculoskeletal  (+) Arthritis ,   Abdominal   Peds  Hematology negative hematology ROS (+)   Anesthesia Other Findings   Reproductive/Obstetrics                             Anesthesia Physical Anesthesia Plan  ASA: 3  Anesthesia Plan: MAC   Post-op Pain Management:    Induction:   PONV Risk Score and Plan: 2 and Propofol infusion, Ondansetron and Treatment may vary due to age or medical condition  Airway Management Planned: Natural Airway and Nasal Cannula  Additional Equipment:   Intra-op Plan:   Post-operative Plan:   Informed Consent: I have reviewed the patients History and Physical, chart, labs and discussed the procedure including the risks, benefits and alternatives for the proposed anesthesia with the patient or authorized representative who has indicated his/her understanding and acceptance.       Plan Discussed with:   Anesthesia Plan Comments:         Anesthesia Quick Evaluation

## 2021-01-04 NOTE — CV Procedure (Signed)
    TRANSESOPHAGEAL ECHOCARDIOGRAM   NAME:  Joanna Griffin   MRN: [id number] DOB:  1960-11-22   ADMIT DATE: 01/04/2021  INDICATIONS:   PROCEDURE:   Informed consent was obtained prior to the procedure. The risks, benefits and alternatives for the procedure were discussed and the patient comprehended these risks.  Risks include, but are not limited to, cough, sore throat, vomiting, nausea, somnolence, esophageal and stomach trauma or perforation, bleeding, low blood pressure, aspiration, pneumonia, infection, trauma to the teeth and death.    After a procedural time-out, the patient was sedated by the anesthesia service. The transesophageal probe was inserted in the esophagus and stomach without difficulty and multiple views were obtained.    COMPLICATIONS:    There were no immediate complications.  FINDINGS:  LEFT VENTRICLE: EF = 65-70%. Severe LVH. No regional wall motion abnormalities.  RIGHT VENTRICLE: Mild to moderately dilated. Normal function.   LEFT ATRIUM: Moderately dilated  LEFT ATRIAL APPENDAGE: No thrombus.   RIGHT ATRIUM: Moderately dilated.   AORTIC VALVE:  Trileaflet. Normal No AI/AS.  MITRAL VALVE:    Normal. Mild MR  TRICUSPID VALVE: Normal. Mild to moderate TR  PULMONIC VALVE: Grossly normal. 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

## 2021-01-04 NOTE — Anesthesia Procedure Notes (Signed)
Procedure Name: MAC Date/Time: 01/04/2021 9:45 AM Performed by: Jenne Campus, CRNA Pre-anesthesia Checklist: Patient identified, Emergency Drugs available, Suction available and Patient being monitored Oxygen Delivery Method: Nasal cannula

## 2021-01-04 NOTE — Anesthesia Postprocedure Evaluation (Signed)
Anesthesia Post Note  Patient: Joanna Griffin  Procedure(s) Performed: TRANSESOPHAGEAL ECHOCARDIOGRAM (TEE)     Patient location during evaluation: PACU Anesthesia Type: MAC Level of consciousness: awake and alert Pain management: pain level controlled Vital Signs Assessment: post-procedure vital signs reviewed and stable Respiratory status: spontaneous breathing, nonlabored ventilation, respiratory function stable and patient connected to nasal cannula oxygen Cardiovascular status: stable and blood pressure returned to baseline Postop Assessment: no apparent nausea or vomiting Anesthetic complications: no   No notable events documented.  Last Vitals:  Vitals:   01/04/21 1025 01/04/21 1035  BP: (!) 107/49 (!) 118/55  Pulse: 77 77  Resp: (!) 23 19  Temp:    SpO2: 100% 100%    Last Pain:  Vitals:   01/04/21 1035  TempSrc:   PainSc: 0-No pain                 [name]

## 2021-01-04 NOTE — Interval H&P Note (Signed)
History and Physical Interval Note:  01/04/2021 9:51 AM  Joanna Griffin  has presented today for surgery, with the diagnosis of PFO.  The various methods of treatment have been discussed with the patient and family. After consideration of risks, benefits and other options for treatment, the patient has consented to  Procedure(s): TRANSESOPHAGEAL ECHOCARDIOGRAM (TEE) (N/A) as a surgical intervention.  The patient's history has been reviewed, patient examined, no change in status, stable for surgery.  I have reviewed the patient's chart and labs.  Questions were answered to the patient's satisfaction.     [name]

## 2021-01-05 ENCOUNTER — Encounter (HOSPITAL_COMMUNITY): Payer: Self-pay | Admitting: Internal Medicine

## 2021-01-07 ENCOUNTER — Encounter: Payer: Self-pay | Admitting: Cardiovascular Disease

## 2021-01-07 ENCOUNTER — Ambulatory Visit (INDEPENDENT_AMBULATORY_CARE_PROVIDER_SITE_OTHER): Payer: No Typology Code available for payment source | Admitting: Cardiovascular Disease

## 2021-01-07 ENCOUNTER — Other Ambulatory Visit: Payer: Self-pay

## 2021-01-07 VITALS — BP 118/76 | HR 66 | Ht 65.0 in | Wt 133.4 lb

## 2021-01-07 DIAGNOSIS — I10 Essential (primary) hypertension: Secondary | ICD-10-CM

## 2021-01-07 DIAGNOSIS — Q2111 Secundum atrial septal defect: Secondary | ICD-10-CM

## 2021-01-07 DIAGNOSIS — Q211 Atrial septal defect: Secondary | ICD-10-CM | POA: Diagnosis not present

## 2021-01-07 MED ORDER — CLOPIDOGREL BISULFATE 75 MG PO TABS: 75.0000 mg | ORAL_TABLET | Freq: Every day | ORAL | 3 refills | Status: AC

## 2021-01-07 NOTE — Progress Notes (Signed)
Cardiology Office Note:    Date:  01/11/2021   ID:  Joanna Griffin, DOB Dec 11, 1960, MRN [id number]  PCP:  Simone Curia, MD   [facility] Providers Cardiologist:  Thomasene Ripple, DO     Referring MD: Simone Curia, MD   Chief Complaint  Patient presents with   Shortness of Breath     History of Present Illness:    Joanna Griffin is a 60 y.o. female referred for evaluation of an ASD.   The patient has been followed by Dr. Tomie China and was diagnosed with pulmonary hypertension.  She was referred to the advanced heart failure clinic for further evaluation of pulmonary hypertension and was seen by Dr. Gala Romney on 12/25/2020.  She has been demonstrated to have angiographically normal coronary arteries.  An echocardiogram in May 2022 showed hyperdynamic LV systolic function with LVEF 70 to 75%, grade 2 diastolic dysfunction, RV dilatation with normal RV function, RV systolic pressure estimated at 61 mmHg, and massive biatrial enlargement.  A cardiac MRI performed 09/14/2020 demonstrated asymmetric LVH, severe RV dilatation, moderate to severe tricuspid regurgitation, and a moderate pericardial effusion.  Based on her presentation with mild pulmonary hypertension and RV enlargement, O2 step up on right heart catheterization, an ASD was suspected and the patient underwent transesophageal echo. This study confirmed a moderate sized ASD with evidence of right heart volume overload.   The patient complains of fatigue and shortness of breath with moderate level activity. She denies orthopnea, PND, or chest pain. She does experience frequent heart palpitations and recently underwent an outpatient monitor demonstrating no evidence of atrial fibrillation. While she reportedly has a hx of PAF, she has not been anticoagulated.   Past Medical History:  Diagnosis Date   Abnormal liver enzymes    Allergic rhinitis    Angina pectoris (HCC) 07/30/2020   Anxiety    Depression    DOE (dyspnea on exertion)  07/30/2020   Dyspnea    Hyperglycemia    Hypertension    Hypertension, benign    Menopause    Migraine    Osteoarthritis    Paroxysmal atrial fibrillation [facility])     Past Surgical History:  Procedure Laterality Date   ABDOMINAL HYSTERECTOMY     LEFT HEART CATH AND CORONARY ANGIOGRAPHY N/A 08/03/2020   Procedure: LEFT HEART CATH AND CORONARY ANGIOGRAPHY;  Surgeon: Lennette Bihari, MD;  Location: [facility] INVASIVE CV LAB;  Service: Cardiovascular;  Laterality: N/A;   NO PAST SURGERIES     RIGHT HEART CATH N/A 10/23/2020   Procedure: RIGHT HEART CATH;  Surgeon: Dolores Patty, MD;  Location: [facility] INVASIVE CV LAB;  Service: Cardiovascular;  Laterality: N/A;   TEE WITHOUT CARDIOVERSION N/A 01/04/2021   Procedure: TRANSESOPHAGEAL ECHOCARDIOGRAM (TEE);  Surgeon: Dolores Patty, MD;  Location: [facility] ENDOSCOPY;  Service: Cardiovascular;  Laterality: N/A;    Current Medications: Current Meds  Medication Sig   acetaminophen (TYLENOL) 325 MG tablet Take 650 mg by mouth every 6 (six) hours as needed for moderate pain.   ALPRAZolam (XANAX) 0.25 MG tablet Take 0.25 mg by mouth every 8 (eight) hours as needed for anxiety.   aspirin EC 81 MG tablet Take 81 mg by mouth daily. Start 02/05/21   clopidogrel (PLAVIX) 75 MG tablet Take 1 tablet (75 mg total) by mouth daily. Start on 02/05/21   estradiol (ESTRACE) 2 MG tablet Take 2 mg by mouth daily.   loratadine (CLARITIN) 10 MG tablet Take 10 mg by mouth daily as needed for  allergies.   meloxicam (MOBIC) 15 MG tablet Take 15 mg by mouth daily as needed for pain.   Menthol-Methyl Salicylate (BEN GAY GREASELESS) 10-15 % greaseless cream Apply 1 application topically daily as needed for pain.   metoprolol succinate (TOPROL-XL) 25 MG 24 hr tablet Take 2 tablets (50 mg total) by mouth daily. Take with or immediately following a meal.   nitroGLYCERIN (NITROSTAT) 0.4 MG SL tablet Place 0.4 mg under the tongue every 5 (five) minutes as needed for chest pain.    sennosides-docusate sodium (SENOKOT-S) 8.6-50 MG tablet Take 2 tablets by mouth daily.   Sofosbuvir-Velpatasvir 400-100 MG TABS Take 1 tablet by mouth daily.   spironolactone (ALDACTONE) 25 MG tablet Take 0.5 tablets (12.5 mg total) by mouth daily.   SUMAtriptan (IMITREX) 100 MG tablet Take 100 mg by mouth every 2 (two) hours as needed for migraine. May repeat in 2 hours if headache persists or recurs.     Allergies:   Hydrocodone-acetaminophen, Codeine, and Morphine and related   Social History   Socioeconomic History   Marital status: Single    Spouse name: Not on file   Number of children: Not on file   Years of education: Not on file   Highest education level: Not on file  Occupational History   Not on file  Tobacco Use   Smoking status: Never   Smokeless tobacco: Never  Substance and Sexual Activity   Alcohol use: Never   Drug use: Never   Sexual activity: Not on file  Other Topics Concern   Not on file  Social History Narrative   Not on file   Social Determinants of Health   Financial Resource Strain: Not on file  Food Insecurity: Not on file  Transportation Needs: Not on file  Physical Activity: Not on file  Stress: Not on file  Social Connections: Not on file     Family History: The patient's family history includes Hypertension in her father and mother.  ROS:   Please see the history of present illness.    All other systems reviewed and are negative.  EKGs/Labs/Other Studies Reviewed:    The following studies were reviewed today: Cardiac Cath 10/23/20: Findings:   RA = 6 RV = 38/8 PA = 40/17 (24) PCW = 19 Fick cardiac output/index = 7.0/4.1 PVR = 2.0 WU Ao sat = 99% High SVC sat = 77% High RA = 82% Low RA = 91% RV = 89% PA = 87%, 88% Qp/Qs = 1.09   Assessment: 1. 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   TEE: FINDINGS:   LEFT  VENTRICLE: EF = 65-70%. Severe LVH. No regional wall motion abnormalities.  RIGHT VENTRICLE: Mild to moderately dilated. Normal function.    LEFT ATRIUM: Moderately dilated   LEFT ATRIAL APPENDAGE: No thrombus.    RIGHT ATRIUM: Moderately dilated.    AORTIC VALVE:  Trileaflet. Normal No AI/AS.   MITRAL VALVE:    Normal. Mild MR   TRICUSPID VALVE: Normal. Mild to moderate TR   PULMONIC VALVE: Grossly normal. 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  The ekg ordered 12/25/20 demonstrates NSR, HR 72 bpm, biatrial enlargement  Recent Labs: 10/20/2020: TSH 1.088 12/25/2020: ALT 17; B Natriuretic Peptide 143.9;  BUN 14; Creatinine, Ser 0.87; Hemoglobin 16.6; Platelets 168; Potassium 4.7; Sodium 137  Recent Lipid Panel No results found for: CHOL, TRIG, HDL, CHOLHDL, VLDL, LDLCALC, LDLDIRECT     Physical Exam:    VS:  BP 118/76   Pulse 66   Ht 5\' 5"  (1.651 m)   Wt 133 lb 6.4 oz (60.5 kg)   BMI 22.20 kg/m     Wt Readings from Last 3 Encounters:  01/07/21 133 lb 6.4 oz (60.5 kg)  01/04/21 135 lb (61.2 kg)  12/25/20 133 lb 12.8 oz (60.7 kg)     GEN:  Well nourished, well developed in no acute distress HEENT: Normal NECK: No JVD; No carotid bruits LYMPHATICS: No lymphadenopathy CARDIAC: RRR, no murmurs, rubs, gallops RESPIRATORY:  Clear to auscultation without rales, wheezing or rhonchi  ABDOMEN: Soft, non-tender, non-distended MUSCULOSKELETAL:  No edema; No deformity  SKIN: Warm and dry NEUROLOGIC:  Alert and oriented x 3 PSYCHIATRIC:  Normal affect   ASSESSMENT:    1. Secundum ASD    PLAN:    In order of problems listed above:  Reviewed multiple imaging modalities including cardiac CTA and MRI studies, TEE, and cardiac cath. The patient has a large secundum ASD with evidence of hemodynamically significant left to right  shunting. The RV is enlarged and there is mild pulmonary HTN present. This is a clear indication to recommend closure of the ASD. CTA confirms normal poulmonary venous drainage. Anatomy is favorable for transcatheter closure with adequate tissue rims around the ASD. I have demonstrated the ASD device to the patient today, discussed procedural steps, potential complications, and long-term benefits of undergoing ASD closure. She understand that there is no demonstrated benefit to ASD closure with respect to atrial fibrillation, but successful closure would be predicted to prevent further RV dilatation and pulmonary HTN from continue volume load on the right heart. I have discussed specific complications of device closure such as vascular injury, infection, bleeding, stroke, MI, device embolization, arrhythmia, late device erosion, emergency surgery, cardiac injury with tamponade, and death. She understands that any serious complication occurs at low frequency of less than 1%. The patient provides full informed consent for transcatheter ASD closure.      Medication Adjustments/Labs and Tests Ordered: Current medicines are reviewed at length with the patient today.  Concerns regarding medicines are outlined above.  Orders Placed This Encounter  Procedures   Basic Metabolic Panel (BMET)   CBC   Meds ordered this encounter  Medications   clopidogrel (PLAVIX) 75 MG tablet    Sig: Take 1 tablet (75 mg total) by mouth daily. Start on 02/05/21    Dispense:  90 tablet    Refill:  3    Patient Instructions  CLOSURE INSTRUCTIONS: You are scheduled for a PFO/ASD CLOSURE on Wednesday, October 19 with Dr. October 21.  1. Please arrive at the [facility] (Main Entrance A) at [facility]: [address] [place], [place] at 6:30 AM (This time is two hours before your procedure to ensure your preparation). Free valet parking service is available. You are allowed ONE visitor in the waiting  room during your procedure. Both you and your guest must wear masks. Special note: Every effort is made to have your procedure done on time. Please understand that emergencies sometimes delay scheduled procedures.  2. Diet: Make sure to stay well hydrated the day before your procedure! Do not eat solid foods after midnight.  You may have clear liquids until 5am upon  the day of the procedure.  3. Labs: You will need to have blood drawn (CBC, BMET within 30 days of procedure) on Friday, October 7. You do not need to be fasting.  4. Medication instructions in preparation for your procedure: 1) START PLAVIX 75 MG DAILY 5 DAYS PRIOR TO  PROCEDURE (Start 02/05/21)  2) START ASPRIN 81 MG DAILY 5 DAYS PRIOR TO PROCEDURE (Start 02/05/21)   3) MAKE SURE TO TAKE YOUR ASPIRIN AND PLAVIX the morning of your closure  4) Hold Spironolactone the morning of your procedure   5) Other meds may be taken as directed with a sip of water.  6. Plan for one night stay--bring personal belongings (this is a disclaimer, not an intention. We want you to go home!). 7. Bring a current list of your medications and current insurance cards. 8. You MUST have a responsible person to drive you home. 9. Someone MUST be with you the first 24 hours after you arrive home or your discharge will be delayed. 10. Please wear clothes that are easy to get on and off and wear slip-on shoes.   FOLLOW-UP APPOINTMENT: You are scheduled to see Dr. Thomasene Ripple on 03/16/21 @ 2:00 PM   Signed, Tonny Bollman, MD  01/11/2021 5:44 PM     [facility]

## 2021-01-07 NOTE — Patient Instructions (Addendum)
CLOSURE INSTRUCTIONS: You are scheduled for a PFO/ASD CLOSURE on Wednesday, October 19 with Dr. Tonny Bollman.  1. Please arrive at the [facility] (Main Entrance A) at [facility]: [address] [place], [place] [address] at 6:30 AM (This time is two hours before your procedure to ensure your preparation). Free valet parking service is available. You are allowed ONE visitor in the waiting room during your procedure. Both you and your guest must wear masks. Special note: Every effort is made to have your procedure done on time. Please understand that emergencies sometimes delay scheduled procedures.  2. Diet: Make sure to stay well hydrated the day before your procedure! Do not eat solid foods after midnight.  You may have clear liquids until 5am upon the day of the procedure.  3. Labs: You will need to have blood drawn (CBC, BMET within 30 days of procedure) on Friday, October 7. You do not need to be fasting.  4. Medication instructions in preparation for your procedure: 1) START PLAVIX 75 MG DAILY 5 DAYS PRIOR TO  PROCEDURE (Start 02/05/21)  2) START ASPRIN 81 MG DAILY 5 DAYS PRIOR TO PROCEDURE (Start 02/05/21)   3) MAKE SURE TO TAKE YOUR ASPIRIN AND PLAVIX the morning of your closure  4) Hold Spironolactone the morning of your procedure   5) Other meds may be taken as directed with a sip of water.  6. Plan for one night stay--bring personal belongings (this is a disclaimer, not an intention. We want you to go home!). 7. Bring a current list of your medications and current insurance cards. 8. You MUST have a responsible person to drive you home. 9. Someone MUST be with you the first 24 hours after you arrive home or your discharge will be delayed. 10. Please wear clothes that are easy to get on and off and wear slip-on shoes.   FOLLOW-UP APPOINTMENT: You are scheduled to see Dr. Thomasene Ripple on 03/16/21 @ 2:00 PM

## 2021-01-11 ENCOUNTER — Encounter: Payer: Self-pay | Admitting: Cardiovascular Disease

## 2021-01-19 ENCOUNTER — Telehealth: Payer: Self-pay

## 2021-01-19 NOTE — Telephone Encounter (Signed)
Called patient to confirm new time for ASD closure on 02/10/2021. (She was rescheduled due to rep availability.) She will now arrive at 1130 for 1330 procedure.  Was unable to leave message as no VM picked up. Will try again later.

## 2021-01-20 ENCOUNTER — Ambulatory Visit: Payer: PRIVATE HEALTH INSURANCE | Admitting: Cardiology

## 2021-01-20 NOTE — Telephone Encounter (Signed)
Confirmed new arrival time for patient's ASD closure 10/19 as 1130 for 1330 case. She was grateful for call and agrees with plan.

## 2021-01-29 ENCOUNTER — Other Ambulatory Visit: Payer: No Typology Code available for payment source | Admitting: *Deleted

## 2021-01-29 ENCOUNTER — Other Ambulatory Visit: Payer: Self-pay

## 2021-01-29 DIAGNOSIS — Q2111 Secundum atrial septal defect: Secondary | ICD-10-CM

## 2021-01-29 LAB — BASIC METABOLIC PANEL
BUN/Creatinine Ratio: 16 (ref 9–23)
BUN: 13 mg/dL (ref 6–24)
CO2: 23 mmol/L (ref 20–29)
Calcium: 9.1 mg/dL (ref 8.7–10.2)
Chloride: 102 mmol/L (ref 96–106)
Creatinine, Ser: 0.83 mg/dL (ref 0.57–1.00)
Glucose: 92 mg/dL (ref 70–99)
Potassium: 3.8 mmol/L (ref 3.5–5.2)
Sodium: 139 mmol/L (ref 134–144)
eGFR: 82 mL/min/{1.73_m2} (ref >59)

## 2021-01-29 LAB — CBC
Hematocrit: 41.2 % (ref 34.0–46.6)
Hemoglobin: 14.3 g/dL (ref 11.1–15.9)
MCH: 36.6 pg — ABNORMAL HIGH (ref 26.6–33.0)
MCHC: 34.7 g/dL (ref 31.5–35.7)
MCV: 105 fL — ABNORMAL HIGH (ref 79–97)
Platelets: 133 10*3/uL — ABNORMAL LOW (ref 150–450)
RBC: 3.91 x10E6/uL (ref 3.77–5.28)
RDW: 12 % (ref 11.7–15.4)
WBC: 7.6 10*3/uL (ref 3.4–10.8)

## 2021-02-09 ENCOUNTER — Telehealth: Payer: Self-pay | Admitting: *Deleted

## 2021-02-09 NOTE — Telephone Encounter (Signed)
ASD closure  scheduled at [facility] for: Wednesday February 10, 2021 1:30 PM Arrive [facility] Main Entrance A [facility]) at: 11:30 AM   No solid food after midnight prior to cath, clear liquids until 5 AM day of procedure.  Medication instructions: Hold: Spironolactone-AM of procedure  Except hold medications usual morning medications can be taken pre-cath with sips of water including: - aspirin 81 mg -Plavix 75 mg    Confirmed patient has responsible adult to drive home post procedure and be with patient first 24 hours after arriving home.  [facility] does allow one visitor to accompany you and wait in the hospital waiting room while you are there for your procedure. You and your visitor will be asked to wear a mask once you enter the hospital.   Patient reports does not currently have any symptoms concerning for COVID-19 and no household members with COVID-19 like illness.      Reviewed procedure/mask/visitor instructions with patient.

## 2021-02-10 ENCOUNTER — Ambulatory Visit (HOSPITAL_COMMUNITY)
Admission: RE | Admit: 2021-02-10 | Discharge: 2021-02-11 | Disposition: A | Discharge status: Home / Self Care | Payer: PRIVATE HEALTH INSURANCE | Source: Ambulatory Visit | Attending: Cardiovascular Disease | Admitting: Cardiovascular Disease

## 2021-02-10 ENCOUNTER — Other Ambulatory Visit: Payer: Self-pay

## 2021-02-10 ENCOUNTER — Encounter (HOSPITAL_COMMUNITY)
Admission: RE | Disposition: A | Discharge status: Home / Self Care | Payer: Self-pay | Source: Ambulatory Visit | Attending: Cardiovascular Disease

## 2021-02-10 DIAGNOSIS — Z8249 Family history of ischemic heart disease and other diseases of the circulatory system: Secondary | ICD-10-CM | POA: Diagnosis not present

## 2021-02-10 DIAGNOSIS — M1990 Unspecified osteoarthritis, unspecified site: Secondary | ICD-10-CM | POA: Diagnosis present

## 2021-02-10 DIAGNOSIS — I480 Paroxysmal atrial fibrillation: Secondary | ICD-10-CM | POA: Diagnosis not present

## 2021-02-10 DIAGNOSIS — I272 Pulmonary hypertension, unspecified: Secondary | ICD-10-CM | POA: Diagnosis not present

## 2021-02-10 DIAGNOSIS — Z79899 Other long term (current) drug therapy: Secondary | ICD-10-CM | POA: Diagnosis not present

## 2021-02-10 DIAGNOSIS — Q2111 Secundum atrial septal defect: Secondary | ICD-10-CM | POA: Diagnosis not present

## 2021-02-10 DIAGNOSIS — I119 Hypertensive heart disease without heart failure: Secondary | ICD-10-CM | POA: Insufficient documentation

## 2021-02-10 DIAGNOSIS — Q211 Atrial septal defect, unspecified: Secondary | ICD-10-CM

## 2021-02-10 DIAGNOSIS — Z885 Allergy status to narcotic agent status: Secondary | ICD-10-CM | POA: Insufficient documentation

## 2021-02-10 DIAGNOSIS — F419 Anxiety disorder, unspecified: Secondary | ICD-10-CM | POA: Diagnosis present

## 2021-02-10 DIAGNOSIS — Z7982 Long term (current) use of aspirin: Secondary | ICD-10-CM | POA: Diagnosis not present

## 2021-02-10 DIAGNOSIS — Z20822 Contact with and (suspected) exposure to covid-19: Secondary | ICD-10-CM | POA: Diagnosis not present

## 2021-02-10 DIAGNOSIS — Z7902 Long term (current) use of antithrombotics/antiplatelets: Secondary | ICD-10-CM | POA: Insufficient documentation

## 2021-02-10 DIAGNOSIS — Z8774 Personal history of (corrected) congenital malformations of heart and circulatory system: Secondary | ICD-10-CM

## 2021-02-10 DIAGNOSIS — I10 Essential (primary) hypertension: Secondary | ICD-10-CM | POA: Diagnosis present

## 2021-02-10 HISTORY — PX: ATRIAL SEPTAL DEFECT(ASD) CLOSURE: CATH118299

## 2021-02-10 LAB — POCT ACTIVATED CLOTTING TIME: Activated Clotting Time: 254 seconds

## 2021-02-10 LAB — SARS CORONAVIRUS 2 BY RT PCR (HOSPITAL ORDER, PERFORMED IN ~~LOC~~ HOSPITAL LAB): SARS Coronavirus 2: NEGATIVE

## 2021-02-10 SURGERY — ATRIAL SEPTAL DEFECT (ASD) CLOSURE
Anesthesia: LOCAL

## 2021-02-10 MED ORDER — SODIUM CHLORIDE 0.9 % IV SOLN: 250.0000 mL | INTRAVENOUS | Status: DC | PRN

## 2021-02-10 MED ORDER — METOPROLOL SUCCINATE ER 25 MG PO TB24
25.0000 mg | ORAL_TABLET | Freq: Two times a day (BID) | ORAL | Status: DC
  Administered 2021-02-10 – 2021-02-11 (×2): 25 mg via ORAL
  Filled 2021-02-10 (×2): qty 1

## 2021-02-10 MED ORDER — MIDAZOLAM HCL 2 MG/2ML IJ SOLN
INTRAMUSCULAR | Status: AC
  Filled 2021-02-10: qty 2

## 2021-02-10 MED ORDER — ONDANSETRON HCL 4 MG/2ML IJ SOLN: 4.0000 mg | Freq: Four times a day (QID) | INTRAMUSCULAR | Status: DC | PRN

## 2021-02-10 MED ORDER — HEPARIN SODIUM (PORCINE) 1000 UNIT/ML IJ SOLN
INTRAMUSCULAR | Status: AC
  Filled 2021-02-10: qty 1

## 2021-02-10 MED ORDER — CLOPIDOGREL BISULFATE 75 MG PO TABS
75.0000 mg | ORAL_TABLET | Freq: Every day | ORAL | Status: DC
  Administered 2021-02-11: 75 mg via ORAL
  Filled 2021-02-10: qty 1

## 2021-02-10 MED ORDER — ASPIRIN 81 MG PO CHEW: 81.0000 mg | CHEWABLE_TABLET | ORAL | Status: DC

## 2021-02-10 MED ORDER — SODIUM CHLORIDE 0.9 % IV SOLN: INTRAVENOUS | Status: AC

## 2021-02-10 MED ORDER — SODIUM CHLORIDE 0.9% FLUSH: 3.0000 mL | INTRAVENOUS | Status: DC | PRN

## 2021-02-10 MED ORDER — SODIUM CHLORIDE 0.9% FLUSH
3.0000 mL | Freq: Two times a day (BID) | INTRAVENOUS | Status: DC
  Administered 2021-02-10 – 2021-02-11 (×2): 3 mL via INTRAVENOUS

## 2021-02-10 MED ORDER — ACETAMINOPHEN 325 MG PO TABS: 650.0000 mg | ORAL_TABLET | Freq: Four times a day (QID) | ORAL | Status: DC | PRN

## 2021-02-10 MED ORDER — ASPIRIN EC 81 MG PO TBEC
81.0000 mg | DELAYED_RELEASE_TABLET | Freq: Every day | ORAL | Status: DC
  Administered 2021-02-11: 81 mg via ORAL
  Filled 2021-02-10: qty 1

## 2021-02-10 MED ORDER — SENNOSIDES-DOCUSATE SODIUM 8.6-50 MG PO TABS: 2.0000 | ORAL_TABLET | Freq: Every day | ORAL | Status: DC | PRN

## 2021-02-10 MED ORDER — SODIUM CHLORIDE 0.9 % WEIGHT BASED INFUSION: 3.0000 mL/kg/h | INTRAVENOUS | Status: DC

## 2021-02-10 MED ORDER — SODIUM CHLORIDE 0.9 % WEIGHT BASED INFUSION: 1.0000 mL/kg/h | INTRAVENOUS | Status: DC

## 2021-02-10 MED ORDER — LABETALOL HCL 5 MG/ML IV SOLN: 10.0000 mg | INTRAVENOUS | Status: AC | PRN

## 2021-02-10 MED ORDER — MIDAZOLAM HCL 2 MG/2ML IJ SOLN
INTRAMUSCULAR | Status: DC | PRN
  Administered 2021-02-10: 2 mg via INTRAVENOUS

## 2021-02-10 MED ORDER — ESTRADIOL 1 MG PO TABS
2.0000 mg | ORAL_TABLET | Freq: Every day | ORAL | Status: DC
  Administered 2021-02-11: 2 mg via ORAL
  Filled 2021-02-10: qty 2
  Filled 2021-02-10: qty 1

## 2021-02-10 MED ORDER — CEFAZOLIN SODIUM-DEXTROSE 2-4 GM/100ML-% IV SOLN
INTRAVENOUS | Status: AC
  Filled 2021-02-10: qty 100

## 2021-02-10 MED ORDER — CEFAZOLIN SODIUM-DEXTROSE 2-4 GM/100ML-% IV SOLN
2.0000 g | INTRAVENOUS | Status: AC
  Administered 2021-02-10: 2 g via INTRAVENOUS

## 2021-02-10 MED ORDER — LIDOCAINE-EPINEPHRINE 1 %-1:100000 IJ SOLN
INTRAMUSCULAR | Status: AC
  Filled 2021-02-10: qty 1

## 2021-02-10 MED ORDER — FENTANYL CITRATE (PF) 100 MCG/2ML IJ SOLN
INTRAMUSCULAR | Status: AC
  Filled 2021-02-10: qty 2

## 2021-02-10 MED ORDER — IOHEXOL 350 MG/ML SOLN
INTRAVENOUS | Status: DC | PRN
  Administered 2021-02-10: 10 mL

## 2021-02-10 MED ORDER — SPIRONOLACTONE 12.5 MG HALF TABLET
12.5000 mg | ORAL_TABLET | Freq: Every day | ORAL | Status: DC
  Administered 2021-02-11: 12.5 mg via ORAL
  Filled 2021-02-10: qty 1

## 2021-02-10 MED ORDER — HEPARIN (PORCINE) IN NACL 1000-0.9 UT/500ML-% IV SOLN
INTRAVENOUS | Status: AC
  Filled 2021-02-10: qty 500

## 2021-02-10 MED ORDER — HYDRALAZINE HCL 20 MG/ML IJ SOLN: 10.0000 mg | INTRAMUSCULAR | Status: AC | PRN

## 2021-02-10 MED ORDER — LIDOCAINE-EPINEPHRINE 1 %-1:100000 IJ SOLN
INTRAMUSCULAR | Status: DC | PRN
  Administered 2021-02-10: 4 mL

## 2021-02-10 MED ORDER — LIDOCAINE HCL (PF) 1 % IJ SOLN
INTRAMUSCULAR | Status: DC | PRN
  Administered 2021-02-10: 15 mL via INTRADERMAL

## 2021-02-10 MED ORDER — CLOPIDOGREL BISULFATE 75 MG PO TABS: 75.0000 mg | ORAL_TABLET | ORAL | Status: DC

## 2021-02-10 MED ORDER — LIDOCAINE HCL (PF) 1 % IJ SOLN
INTRAMUSCULAR | Status: AC
  Filled 2021-02-10: qty 30

## 2021-02-10 MED ORDER — SODIUM CHLORIDE 0.9% FLUSH
3.0000 mL | Freq: Two times a day (BID) | INTRAVENOUS | Status: DC
  Administered 2021-02-11: 3 mL via INTRAVENOUS

## 2021-02-10 MED ORDER — LORATADINE 10 MG PO TABS: 10.0000 mg | ORAL_TABLET | Freq: Every day | ORAL | Status: DC | PRN

## 2021-02-10 MED ORDER — FENTANYL CITRATE (PF) 100 MCG/2ML IJ SOLN
INTRAMUSCULAR | Status: DC | PRN
  Administered 2021-02-10 (×2): 50 ug via INTRAVENOUS

## 2021-02-10 MED ORDER — HEPARIN SODIUM (PORCINE) 1000 UNIT/ML IJ SOLN
INTRAMUSCULAR | Status: DC | PRN
  Administered 2021-02-10: 5000 [IU] via INTRAVENOUS

## 2021-02-10 MED ORDER — ALPRAZOLAM 0.25 MG PO TABS: 0.2500 mg | ORAL_TABLET | Freq: Three times a day (TID) | ORAL | Status: DC | PRN

## 2021-02-10 SURGICAL SUPPLY — 18 items
BALLN SIZING AMPLATZER 24 (BALLOONS) ×2
BALLOON SIZING AMPLATZER 24 (BALLOONS) IMPLANT
BALN SZ 70X4.5 7FR 3 LUM (BALLOONS) ×1
CATH ACUNAV 8FR 90CM (CATHETERS) ×1 IMPLANT
CATH INFINITI 6F MPA2 100CM (CATHETERS) ×1 IMPLANT
CLOSURE PERCLOSE PROSTYLE (VASCULAR PRODUCTS) ×2 IMPLANT
COVER SWIFTLINK CONNECTOR (BAG) ×1 IMPLANT
GUIDEWIRE AMPLATZER 1.5JX260 (WIRE) ×1 IMPLANT
OCCLUDER AMPLATZER SEPTAL 20MM (Prosthesis & Implant Heart) ×1 IMPLANT
PACK CARDIAC CATHETERIZATION (CUSTOM PROCEDURE TRAY) ×2 IMPLANT
PROTECTION STATION PRESSURIZED (MISCELLANEOUS) ×2
SHEATH PINNACLE 8F 10CM (SHEATH) ×1 IMPLANT
SHEATH PINNACLE 9F 10CM (SHEATH) ×1 IMPLANT
SHEATH PROBE COVER 6X72 (BAG) ×1 IMPLANT
STATION PROTECTION PRESSURIZED (MISCELLANEOUS) IMPLANT
SYS DELIVER AMP TREVISIO 9FR (SHEATH) ×2
SYSTEM DELIVER AMP TREVIS 9FR (SHEATH) IMPLANT
WIRE EMERALD 3MM-J .035X260CM (WIRE) ×1 IMPLANT

## 2021-02-10 NOTE — Discharge Instructions (Signed)

## 2021-02-10 NOTE — H&P (Signed)
Cardiology Admission History and Physical:   Patient ID: Joanna Griffin MRN: [id number]; DOB: Mar 18, 1961   Admission date: 02/10/2021  PCP:  Simone Curia, MD   [facility] Providers Cardiologist:  Thomasene Ripple, DO        Chief Complaint: Atrial septal defect  Patient Profile:   Joanna Griffin is a 60 y.o. female with a secundum ASD and pulmonary hypertension presenting today for transcatheter ASD closure  History of Present Illness:   [name] is a 60 year old woman who was recently evaluated by Dr. Gala Romney for pulmonary hypertension.  She was found to have RV dilatation with normal RV function and an estimated RV systolic pressure of 61 mmHg with massive biatrial enlargement.  She had an oxygen step up on right heart catheterization with suspected ASD.  She underwent transesophageal echo confirming a moderate sized secundum ASD and right heart cath demonstrated only mild pulmonary hypertension.  She was referred for consideration of transcatheter ASD closure and she presents today for the procedure.  She reports no clinical changes since I saw her last in the office in initial consultation January 07, 2021.  She does have continued symptoms of fatigue and exertional dyspnea with moderate level activity.   Past Medical History:  Diagnosis Date   Abnormal liver enzymes    Allergic rhinitis    Angina pectoris (HCC) 07/30/2020   Anxiety    Depression    DOE (dyspnea on exertion) 07/30/2020   Dyspnea    Hyperglycemia    Hypertension    Hypertension, benign    Menopause    Migraine    Osteoarthritis    Paroxysmal atrial fibrillation [facility])     Past Surgical History:  Procedure Laterality Date   ABDOMINAL HYSTERECTOMY     LEFT HEART CATH AND CORONARY ANGIOGRAPHY N/A 08/03/2020   Procedure: LEFT HEART CATH AND CORONARY ANGIOGRAPHY;  Surgeon: Lennette Bihari, MD;  Location: [facility] INVASIVE CV LAB;  Service: Cardiovascular;  Laterality: N/A;   NO PAST SURGERIES     RIGHT  HEART CATH N/A 10/23/2020   Procedure: RIGHT HEART CATH;  Surgeon: Dolores Patty, MD;  Location: [facility] INVASIVE CV LAB;  Service: Cardiovascular;  Laterality: N/A;   TEE WITHOUT CARDIOVERSION N/A 01/04/2021   Procedure: TRANSESOPHAGEAL ECHOCARDIOGRAM (TEE);  Surgeon: Dolores Patty, MD;  Location: [facility] ENDOSCOPY;  Service: Cardiovascular;  Laterality: N/A;     Medications Prior to Admission: Prior to Admission medications   Medication Sig Start Date End Date Taking? Authorizing Provider  acetaminophen (TYLENOL) 325 MG tablet Take 650 mg by mouth every 6 (six) hours as needed for moderate pain.   Yes [provider]  aspirin EC 81 MG tablet Take 81 mg by mouth daily.   Yes [provider]  clopidogrel (PLAVIX) 75 MG tablet Take 1 tablet (75 mg total) by mouth daily. Start on 02/05/21 01/07/21  Yes Tonny Bollman, MD  estradiol (ESTRACE) 2 MG tablet Take 2 mg by mouth daily.   Yes [provider]  metoprolol succinate (TOPROL-XL) 25 MG 24 hr tablet Take 2 tablets (50 mg total) by mouth daily. Take with or immediately following a meal. Patient taking differently: Take 25 mg by mouth 2 (two) times daily. Take with or immediately following a meal. 10/02/20  Yes Revankar, Aundra Dubin, MD  nitroGLYCERIN (NITROSTAT) 0.4 MG SL tablet Place 0.4 mg under the tongue every 5 (five) minutes as needed for chest pain.   Yes [provider]  sennosides-docusate sodium (SENOKOT-S) 8.6-50 MG  tablet Take 2 tablets by mouth daily as needed for constipation.   Yes [provider]  spironolactone (ALDACTONE) 25 MG tablet Take 0.5 tablets (12.5 mg total) by mouth daily. 10/20/20  Yes Bensimhon, Bevelyn Buckles, MD  ALPRAZolam Prudy Feeler) 0.25 MG tablet Take 0.25 mg by mouth every 8 (eight) hours as needed for anxiety.    [provider]  loratadine (CLARITIN) 10 MG tablet Take 10 mg by mouth daily as needed for allergies.    [provider]     Allergies:     Allergies  Allergen Reactions   Hydrocodone-Acetaminophen Nausea And Vomiting    Vomiting   Codeine Nausea And Vomiting   Morphine And Related Nausea Only    Social History:   Social History   Socioeconomic History   Marital status: Single    Spouse name: Not on file   Number of children: Not on file   Years of education: Not on file   Highest education level: Not on file  Occupational History   Not on file  Tobacco Use   Smoking status: Never   Smokeless tobacco: Never  Substance and Sexual Activity   Alcohol use: Never   Drug use: Never   Sexual activity: Not on file  Other Topics Concern   Not on file  Social History Narrative   Not on file   Social Determinants of Health   Financial Resource Strain: Not on file  Food Insecurity: Not on file  Transportation Needs: Not on file  Physical Activity: Not on file  Stress: Not on file  Social Connections: Not on file  Intimate Partner Violence: Not on file    Family History:   The patient's family history includes Hypertension in her father and mother.    ROS:  Please see the history of present illness.  All other ROS reviewed and negative.     Physical Exam/Data:   Vitals:   02/10/21 1124  BP: (!) 125/97  Pulse: 72  Resp: 12  Temp: 98 F (36.7 C)  TempSrc: Oral  SpO2: 92%  Weight: 61.2 kg  Height: 5\' 5"  (1.651 m)   No intake or output data in the 24 hours ending 02/10/21 1203 Last 3 Weights 02/10/2021 01/07/2021 01/04/2021  Weight (lbs) 135 lb 133 lb 6.4 oz 135 lb  Weight (kg) 61.236 kg 60.51 kg 61.236 kg     Body mass index is 22.47 kg/m.  General:  Well nourished, well developed, in no acute distress HEENT: normal Neck: no JVD Vascular: No carotid bruits; Distal pulses 2+ bilaterally   Cardiac:  normal S1, S2; RRR; no murmur  Lungs:  clear to auscultation bilaterally, no wheezing, rhonchi or rales  Abd: soft, nontender, no hepatomegaly  Ext: no edema Musculoskeletal:  No deformities, BUE and  BLE strength normal and equal Skin: warm and dry  Neuro:  CNs 2-12 intact, no focal abnormalities noted Psych:  Normal affect    Laboratory Data:  High Sensitivity Troponin:  No results for input(s): TROPONINIHS in the last 720 hours.    ChemistryNo results for input(s): NA, K, CL, CO2, GLUCOSE, BUN, CREATININE, CALCIUM, MG, GFRNONAA, GFRAA, ANIONGAP in the last 168 hours.  No results for input(s): PROT, ALBUMIN, AST, ALT, ALKPHOS, BILITOT in the last 168 hours. Lipids No results for input(s): CHOL, TRIG, HDL, LABVLDL, LDLCALC, CHOLHDL in the last 168 hours. HematologyNo results for input(s): WBC, RBC, HGB, HCT, MCV, MCH, MCHC, RDW, PLT in the last 168 hours. Thyroid No results for input(s):  TSH, FREET4 in the last 168 hours. BNPNo results for input(s): BNP, PROBNP in the last 168 hours.  DDimer No results for input(s): DDIMER in the last 168 hours.   Radiology/Studies:  No results found.   Assessment and Plan:   60 year old woman with secundum ASD, presenting for transcatheter ASD closure.  Cardiac CTA demonstrates normal pulmonary venous drainage.  She has seen hemodynamically significant ASD with RV dilatation and mild pulmonary hypertension.  We plan to proceed with transcatheter ASD closure.  Risks, indications, and alternatives have been extensively reviewed with the patient.  All of her questions were answered.  She is medically stable to proceed.    For questions or updates, please contact [facility] Please consult [email or web address] for contact info under     Signed, Tonny Bollman, MD  02/10/2021 12:03 PM

## 2021-02-10 NOTE — Progress Notes (Signed)
Pt sitting almost 60 degree angle on the bed , notice to have her dinner. RN checked the right femoral site and it was soaked with blood. VSS. PT AOX4. RN called the Charge Nurse and held pressure for 20 minutes. No hematoma noticed. New dressing placed. Pt instructed NOT to sit up until her bedrest is over.

## 2021-02-11 ENCOUNTER — Ambulatory Visit (HOSPITAL_BASED_OUTPATIENT_CLINIC_OR_DEPARTMENT_OTHER): Payer: PRIVATE HEALTH INSURANCE

## 2021-02-11 ENCOUNTER — Encounter (HOSPITAL_COMMUNITY): Payer: Self-pay | Admitting: Cardiovascular Disease

## 2021-02-11 DIAGNOSIS — Q211 Atrial septal defect, unspecified: Secondary | ICD-10-CM

## 2021-02-11 DIAGNOSIS — Z8774 Personal history of (corrected) congenital malformations of heart and circulatory system: Secondary | ICD-10-CM

## 2021-02-11 DIAGNOSIS — Q2111 Secundum atrial septal defect: Secondary | ICD-10-CM | POA: Diagnosis not present

## 2021-02-11 LAB — BASIC METABOLIC PANEL
Anion gap: 6 (ref 5–15)
BUN: 13 mg/dL (ref 6–20)
CO2: 26 mmol/L (ref 22–32)
Calcium: 9 mg/dL (ref 8.9–10.3)
Chloride: 107 mmol/L (ref 98–111)
Creatinine, Ser: 0.89 mg/dL (ref 0.44–1.00)
GFR, Estimated: >60 mL/min (ref >60)
Glucose, Bld: 97 mg/dL (ref 70–99)
Potassium: 4.4 mmol/L (ref 3.5–5.1)
Sodium: 139 mmol/L (ref 135–145)

## 2021-02-11 LAB — CBC
HCT: 40.7 % (ref 36.0–46.0)
Hemoglobin: 13.2 g/dL (ref 12.0–15.0)
MCH: 35.2 pg — ABNORMAL HIGH (ref 26.0–34.0)
MCHC: 32.4 g/dL (ref 30.0–36.0)
MCV: 108.5 fL — ABNORMAL HIGH (ref 80.0–100.0)
Platelets: 120 10*3/uL — ABNORMAL LOW (ref 150–400)
RBC: 3.75 MIL/uL — ABNORMAL LOW (ref 3.87–5.11)
RDW: 12.4 % (ref 11.5–15.5)
WBC: 7.1 10*3/uL (ref 4.0–10.5)
nRBC: 0 % (ref 0.0–0.2)

## 2021-02-11 LAB — ECHOCARDIOGRAM LIMITED
Height: 65 in
S' Lateral: 1.9 cm
Weight: 2160 oz

## 2021-02-11 MED ORDER — AMOXICILLIN 500 MG PO CAPS: 2000.0000 mg | ORAL_CAPSULE | ORAL | 12 refills | Status: AC

## 2021-02-11 NOTE — Progress Notes (Signed)
  Echocardiogram 2D Echocardiogram has been performed.  Joanna Griffin 02/11/2021, 10:03 AM

## 2021-02-11 NOTE — Progress Notes (Signed)
Patient given discharge instructions and stated understanding.  Patient has called her ride and they are on the way, patient will be discharged via wheelchair with relative driving her home

## 2021-02-11 NOTE — Plan of Care (Signed)

## 2021-02-11 NOTE — Discharge Summary (Signed)
[facility]   MULTIDISCIPLINARY HEART VALVE TEAM  Discharge Summary    Patient ID: Joanna Griffin MRN: [id number]; DOB: May 01, 1960  Admit date: 02/10/2021 Discharge date: 02/11/2021  Primary Care Provider: Simone Curia, MD  Primary Cardiologist: Thomasene Ripple, DO /Dr. Excell Seltzer, MD (ASD closure)  Discharge Diagnoses    Principal Problem:   Status post closure of congenital atrial septal defect (ASD) by percutaneous transcatheter technique Active Problems:   Osteoarthritis   Hypertension   Anxiety   Paroxysmal atrial fibrillation (HCC)   Secundum ASD   Atrial septal defect  Allergies Allergies  Allergen Reactions   Hydrocodone-Acetaminophen Nausea And Vomiting    Vomiting   Codeine Nausea And Vomiting   Morphine And Related Nausea Only   Diagnostic Studies/Procedures    02/10/21 Atrial septal defect closure:  Successful transcatheter closure of a secundum atrial septal defect using a 20 mm Amplatzer atrial septal occluder device under intracardiac echo and fluoroscopic guidance   Recommendations: Overnight observation, aspirin and clopidogrel x6 months, limited 2D echo tomorrow morning prior to discharge, SBE prophylaxis x6 months. _____________   Echo 02/11/21: Completed but pending formal read at the time of discharge   History of Present Illness     Joanna Griffin is a 60 y.o. female with a history of secundum ASD and pulmonary hypertension, HTN, PAF, and anxiety who presented to [facility] on 02/10/21 for planned atrial septal closure.   Joanna Griffin is followed by Dr. Tomie China and was diagnosed with pulmonary hypertension. She was referred to the advanced heart failure clinic for further evaluation of and was seen by Dr. Gala Romney on 12/25/2020. She underwent LHC 08/03/20 which showed angiographically normal coronary arteries. An echocardiogram 08/2020  showed hyperdynamic LV systolic function with LVEF 70 to 75%, grade 2 diastolic dysfunction, RV dilatation with  normal RV function, RV systolic pressure estimated at 61 mmHg, and massive biatrial enlargement. A cardiac MRI performed 09/14/2020 demonstrated asymmetric LVH, severe RV dilatation, moderate to severe tricuspid regurgitation, and a moderate pericardial effusion. Based on her presentation with mild pulmonary hypertension and RV enlargement, O2 step up on right heart catheterization, an ASD was suspected and the patient underwent TEE 01/04/21. This study confirmed a moderate sized ASD with evidence of right heart volume overload.   She was then followed by Dr. Excell Seltzer 01/07/21 at which time the patient continued to have symptoms of SOB and fatigue with moderate activity and was scheduled for planned ASD closure 02/10/21.   Hospital Course    She underwent successful transcatheter closure of a secundum ASD using a 20 mm Amplatzer atrial septal occluder device under intracardiac echo and fluoroscopic guidance. Recommendations are to continue aspirin and clopidogrel x6 months. She will have a limited echocardiogram this morning prior to discharge. She has been scheduled to see Dr. Servando Salina for follow up in one month with repeat echocardiogram prior to appointment. She will also be scheduled for 6 month cardiac MRI. She will need SBE prophylaxis x6 months. This was discussed today.   She had a small right groin bleed overnight however site appears stable this AM. Hb looks great at 13.2. Creatinine at 0.89. SBE with Amoxicillin 2g PO one hour prior to dental work was sent to her preferred pharmacy.   Consultants: None    The patient has been seen and examined by Dr. Excell Seltzer who feels that she is stable and ready for discharge today, 02/11/21.  _____________  Discharge Vitals Blood pressure 123/69, pulse 68, temperature 98 F (36.7 C),  temperature source Oral, resp. rate 18, height 5\' 5"  (1.651 m), weight 61.2 kg, SpO2 100 %.  Filed Weights   02/10/21 1124  Weight: 61.2 kg   General: Well developed, well  nourished, NAD Neck: No JVD Lungs:Clear to ausculation bilaterally.  Cardiovascular: RRR with S1 S2. No murmur Extremities: No edema. Neuro: Alert and oriented. No focal deficits. No facial asymmetry. MAE spontaneously. Psych: Responds to questions appropriately with normal affect.    Labs & Radiologic Studies    CBC Recent Labs    02/11/21 0259  WBC 7.1  HGB 13.2  HCT 40.7  MCV 108.5*  PLT 120*   Basic Metabolic Panel Recent Labs    02/13/21 0259  NA 139  K 4.4  CL 107  CO2 26  GLUCOSE 97  BUN 13  CREATININE 0.89  CALCIUM 9.0   Liver Function Tests No results for input(s): AST, ALT, ALKPHOS, BILITOT, PROT, ALBUMIN in the last 72 hours. No results for input(s): LIPASE, AMYLASE in the last 72 hours. Cardiac Enzymes No results for input(s): CKTOTAL, CKMB, CKMBINDEX, TROPONINI in the last 72 hours. BNP Invalid input(s): POCBNP D-Dimer No results for input(s): DDIMER in the last 72 hours. Hemoglobin A1C No results for input(s): HGBA1C in the last 72 hours. Fasting Lipid Panel No results for input(s): CHOL, HDL, LDLCALC, TRIG, CHOLHDL, LDLDIRECT in the last 72 hours. Thyroid Function Tests No results for input(s): TSH, T4TOTAL, T3FREE, THYROIDAB in the last 72 hours.  Invalid input(s): FREET3 _____________  CARDIAC CATHETERIZATION  Result Date: 02/10/2021 Successful transcatheter closure of a secundum atrial septal defect using a 20 mm Amplatzer atrial septal occluder device under intracardiac echo and fluoroscopic guidance Recommendations: Overnight observation, aspirin and clopidogrel x6 months, limited 2D echo tomorrow morning prior to discharge, SBE prophylaxis x6 months.   Disposition   Pt is being discharged home today in good condition.  Follow-up Plans & Appointments    Follow-up Information     Tobb, Kardie, DO. Go on 03/16/2021.   Specialty: Cardiology Why: at 2pm. Please arrive to your appointment at 1:45pm. Contact information: [address] [place] [phone]         [facility]. Go on 03/15/2021.   Why: Echocardiogram to start at 10:15am. Please arrive at 10:00am. Contact information: [address] [place] [phone]               Discharge Medications   Allergies as of 02/11/2021       Reactions   Hydrocodone-acetaminophen Nausea And Vomiting   Vomiting   Codeine Nausea And Vomiting   Morphine And Related Nausea Only        Medication List     TAKE these medications    acetaminophen 325 MG tablet Commonly known as: TYLENOL Take 650 mg by mouth every 6 (six) hours as needed for moderate pain.   ALPRAZolam 0.25 MG tablet Commonly known as: XANAX Take 0.25 mg by mouth every 8 (eight) hours as needed for anxiety.   amoxicillin 500 MG capsule Commonly known as: AMOXIL Take 4 capsules (2,000 mg total) by mouth as directed. Take 4 tablets 1 hour prior to dental work, including cleanings.   aspirin EC 81 MG tablet Take 81 mg by mouth daily.   clopidogrel 75 MG tablet Commonly known as: PLAVIX Take 1 tablet (75 mg total) by mouth daily. Start on 02/05/21   estradiol 2 MG tablet Commonly known as: ESTRACE Take  2 mg by mouth daily.   loratadine 10 MG tablet Commonly known as: CLARITIN Take 10 mg by mouth daily as needed for allergies.   metoprolol succinate 25 MG 24 hr tablet Commonly known as: TOPROL-XL Take 2 tablets (50 mg total) by mouth daily. Take with or immediately following a meal. What changed:  how much to take when to take this   nitroGLYCERIN 0.4 MG SL tablet Commonly known as: NITROSTAT Place 0.4 mg under the tongue every 5 (five) minutes as needed for chest pain.   sennosides-docusate sodium 8.6-50 MG tablet Commonly known as: SENOKOT-S Take 2 tablets by mouth daily as needed for constipation.   spironolactone 25 MG tablet Commonly known as:  ALDACTONE Take 0.5 tablets (12.5 mg total) by mouth daily.       Outstanding Labs/Studies   None   Duration of Discharge Encounter   Greater than 30 minutes including physician time.  SignedGeorgie Chard, NP 02/11/2021, 10:14 AM [phone]   Patient seen, examined. Available data reviewed. Agree with findings, assessment, and plan as outlined by Georgie Chard, NP.  The patient is independently interviewed and examined this morning.  She is alert, oriented, in no distress.  Lungs are clear, heart is regular rate and rhythm with a 2/6 early systolic murmur at the left lower sternal border, abdomen is soft and nontender, right groin site is clear with minimal saturation of the dressing but no hematoma or ecchymosis.  There is no lower extremity edema.  The patient's echocardiogram this morning is personally reviewed.  It demonstrates vigorous LV function, appropriate atrial septal occluder device position, and a trivial to small pericardial effusion which is unchanged.  The patient is stable for discharge.  Recommend to repeat echocardiogram in 1 month.  Consider repeat cardiac MRI in 6 months to assess the RV.  Continue aspirin and clopidogrel for 6 months.  SBE prophylaxis for 6 months.  Tonny Bollman, M.D. 02/11/2021 10:14 AM

## 2021-02-15 ENCOUNTER — Ambulatory Visit: Payer: PRIVATE HEALTH INSURANCE | Admitting: Cardiology

## 2021-03-15 ENCOUNTER — Ambulatory Visit (HOSPITAL_COMMUNITY): Payer: PRIVATE HEALTH INSURANCE | Attending: Cardiovascular Disease

## 2021-03-15 ENCOUNTER — Other Ambulatory Visit: Payer: Self-pay

## 2021-03-15 ENCOUNTER — Other Ambulatory Visit: Payer: Self-pay | Admitting: *Deleted

## 2021-03-15 DIAGNOSIS — Q2112 Patent foramen ovale: Secondary | ICD-10-CM

## 2021-03-15 DIAGNOSIS — I5032 Chronic diastolic (congestive) heart failure: Secondary | ICD-10-CM

## 2021-03-15 LAB — ECHOCARDIOGRAM COMPLETE
Area-P 1/2: 4.89 cm2
S' Lateral: 2.4 cm

## 2021-03-16 ENCOUNTER — Ambulatory Visit: Payer: Self-pay | Admitting: Cardiology

## 2021-03-23 ENCOUNTER — Telehealth: Payer: Self-pay | Admitting: Cardiology

## 2021-03-23 NOTE — Telephone Encounter (Signed)
° °  Pt is returning call to get echo result °

## 2021-03-23 NOTE — Telephone Encounter (Signed)
The patient has been notified of the result and verbalized understanding.  All questions (if any) were answered. Loa Socks, LPN [id number] 0:92 PM

## 2021-03-23 NOTE — Telephone Encounter (Signed)
-----   Message from [name], NP sent at 03/23/2021 10:01 AM EST ----- Please let the patient know that her follow up echocardiogram shows no leak where the device was placed. Great news!

## 2021-03-31 ENCOUNTER — Telehealth: Payer: Self-pay

## 2021-03-31 NOTE — Telephone Encounter (Signed)
Attempted to reach patient to follow up on sleep study, no answer.

## 2021-04-08 ENCOUNTER — Telehealth (HOSPITAL_COMMUNITY): Payer: Self-pay | Admitting: *Deleted

## 2021-04-09 ENCOUNTER — Telehealth (HOSPITAL_COMMUNITY): Payer: Self-pay | Admitting: Surgery

## 2021-04-09 DIAGNOSIS — G4719 Other hypersomnia: Secondary | ICD-10-CM

## 2021-04-09 NOTE — Telephone Encounter (Signed)
Per Dr. Gala Romney patient referred for split night sleep study.  Insurance would not cover her previously ordered home sleep study.

## 2021-04-09 NOTE — Telephone Encounter (Signed)
I called patient and left a message regarding her ordered home sleep study.  Per Ceasar Lund CMA-her insurance does not cover home sleep studies.   I have called patient to inform her and requested that she return the device to our clinic.  I will forward this message to provider to determine if she should be referred for split night study.

## 2021-04-14 ENCOUNTER — Encounter (HOSPITAL_COMMUNITY): Payer: PRIVATE HEALTH INSURANCE | Admitting: Internal Medicine

## 2021-06-23 ENCOUNTER — Other Ambulatory Visit: Payer: Self-pay | Admitting: Nurse Practitioner

## 2021-06-23 DIAGNOSIS — K7402 Hepatic fibrosis, advanced fibrosis: Secondary | ICD-10-CM

## 2021-06-23 DIAGNOSIS — R772 Abnormality of alphafetoprotein: Secondary | ICD-10-CM

## 2021-07-02 ENCOUNTER — Other Ambulatory Visit: Payer: PRIVATE HEALTH INSURANCE

## 2021-07-27 ENCOUNTER — Other Ambulatory Visit: Payer: Self-pay | Admitting: Cardiology

## 2021-07-27 DIAGNOSIS — R079 Chest pain, unspecified: Secondary | ICD-10-CM

## 2021-08-23 ENCOUNTER — Other Ambulatory Visit: Payer: Self-pay | Admitting: Cardiology

## 2021-08-23 DIAGNOSIS — I10 Essential (primary) hypertension: Secondary | ICD-10-CM

## 2021-08-23 DIAGNOSIS — I480 Paroxysmal atrial fibrillation: Secondary | ICD-10-CM

## 2022-04-27 IMAGING — MR MR CARD MORPHOLOGY WO/W CM
45 of 48 series · 45 of 48 positions shown · IV contrast (gadavist)
Comparison: none

CLINICAL DATA: Evaluate for amyloidosis

EXAM:
CARDIAC MRI
TECHNIQUE: The patient was scanned on a 1.5 Tesla Siemens magnet. A dedicated
cardiac coil was used. Functional imaging was done using [employer]
sequences. [DATE], and 4 chamber views were done to assess for RWMA's.
Modified Maribo rule using a short axis stack was used to
calculate an ejection fraction on a dedicated work station using
[employer] software. The patient received 7 cc of Gadavist. After 10
minutes inversion recovery sequences were used to assess for
infiltration and scar tissue.
CONTRAST:  7 cc  of Gadavist

[Series 4: [id number] · axial · 8.0mm · 1.41mm/px · 1 of 16 slices shown]
[im 1/16]
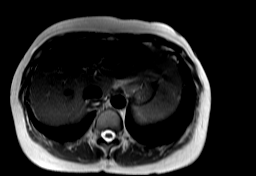

[Series 8: bSSFP · sagittal · 8.0mm · 1.61mm/px · 1 of 25 slices shown (1 of 23)]
[im 1/25]
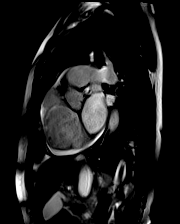

[Series 9: bSSFP · sagittal · 8.0mm · 1.61mm/px · 1 of 25 slices shown (2 of 23)]
[im 1/25]
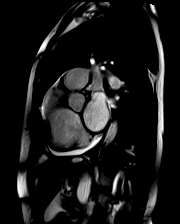

[Series 10: bSSFP · sagittal · 8.0mm · 1.61mm/px · 1 of 25 slices shown (3 of 23)]
[im 1/25]
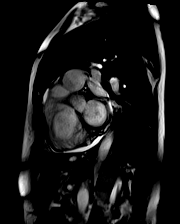

[Series 11: bSSFP · sagittal · 8.0mm · 1.61mm/px · 1 of 25 slices shown (4 of 23)]
[im 1/25]
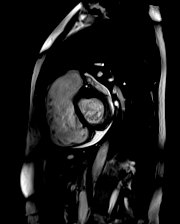

[Series 12: bSSFP · sagittal · 8.0mm · 1.61mm/px · 1 of 25 slices shown (5 of 23)]
[im 1/25]
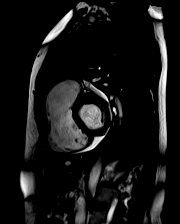

[Series 13: bSSFP · sagittal · 8.0mm · 1.61mm/px · 1 of 25 slices shown (6 of 23)]
[im 1/25]
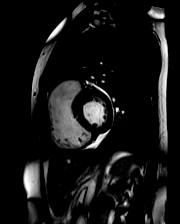

[Series 14: bSSFP · sagittal · 8.0mm · 1.61mm/px · 1 of 25 slices shown (7 of 23)]
[im 1/25]
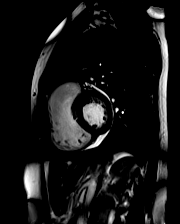

[Series 15: bSSFP · sagittal · 8.0mm · 1.61mm/px · 1 of 25 slices shown (8 of 23)]
[im 1/25]
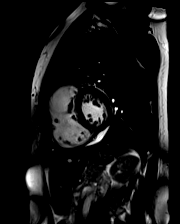

[Series 16: bSSFP · sagittal · 8.0mm · 1.61mm/px · 1 of 25 slices shown (9 of 23)]
[im 1/25]
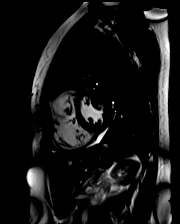

[Series 17: bSSFP · sagittal · 8.0mm · 1.61mm/px · 1 of 25 slices shown (10 of 23)]
[im 1/25]
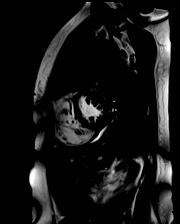

[Series 18: bSSFP · sagittal · 8.0mm · 1.61mm/px · 1 of 25 slices shown (11 of 23)]
[im 1/25]
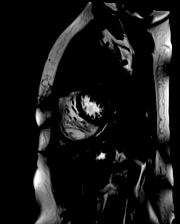

[Series 19: bSSFP · sagittal · 8.0mm · 1.61mm/px · 1 of 25 slices shown (12 of 23)]
[im 1/25]
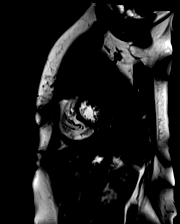

[Series 20: bSSFP · sagittal · 8.0mm · 1.61mm/px · 1 of 25 slices shown (13 of 23)]
[im 1/25]
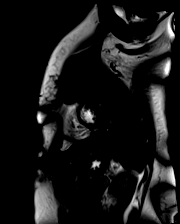

[Series 21: bSSFP · sagittal · 8.0mm · 1.61mm/px · 1 of 25 slices shown (14 of 23)]
[im 1/25]
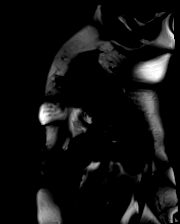

[Series 22: bSSFP · sagittal · 8.0mm · 1.61mm/px · 1 of 25 slices shown (15 of 23)]
[im 1/25]
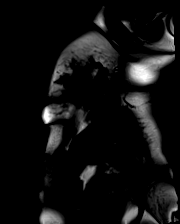

[Series 23: bSSFP · sagittal · 8.0mm · 1.61mm/px · 1 of 25 slices shown (16 of 23)]
[im 1/25]
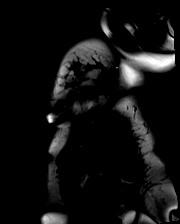

[Series 24: bSSFP · sagittal · 8.0mm · 1.61mm/px · 1 of 25 slices shown (17 of 23)]
[im 1/25]
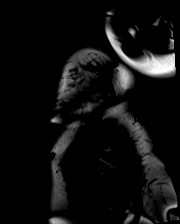

[Series 25: bSSFP · oblique · 6.0mm · 1.41mm/px · 1 of 25 slices shown (18 of 23)]
[im 1/25]
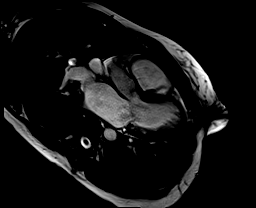

[Series 26: bSSFP · coronal · 6.0mm · 1.41mm/px · 1 of 25 slices shown (19 of 23)]
[im 1/25]
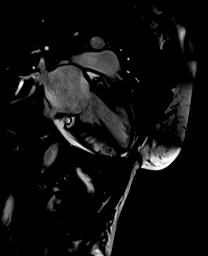

[Series 27: bSSFP · oblique · 6.0mm · 1.41mm/px · 1 of 25 slices shown (20 of 23)]
[im 1/25]
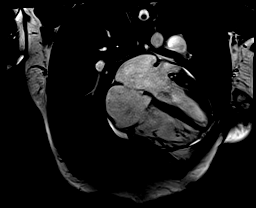

[Series 28: (id)_long_t1 · sagittal · 8.0mm · 1.56mm/px · 1 of 24 slices shown]
[im 1/24]
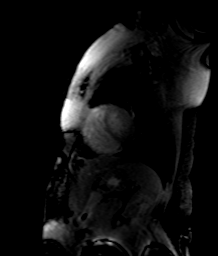

[Series 29: (id)_long_t1_moco · sagittal · 8.0mm · 1.56mm/px · 1 of 24 slices shown]
[im 1/24]
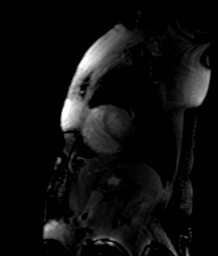

[Series 32: (id)_trufi · sagittal · 8.0mm · 2.08mm/px · 1 of 9 slices shown]
[im 1/9]
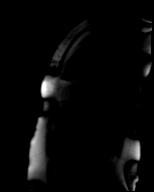

[Series 33: (id)_trufi_moco · sagittal · 8.0mm · 2.08mm/px · 1 of 9 slices shown]
[im 1/9]
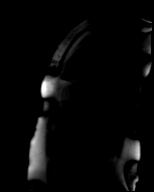

[Series 36: bSSFP · coronal · 6.0mm · 1.41mm/px · 1 of 25 slices shown (21 of 23)]
[im 1/25]
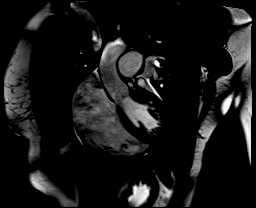

[Series 37: pre short axis · sagittal · non-contrast · 8.0mm · 2.25mm/px · 1 of 10 slices shown (1 of 6)]
[im 1/10]
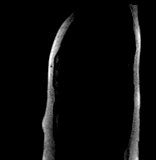

[Series 38: pre short axis · sagittal · non-contrast · 8.0mm · 2.25mm/px · 1 of 10 slices shown (2 of 6)]
[im 1/10]
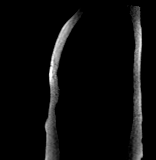

[Series 39: pre short axis · sagittal · non-contrast · 8.0mm · 2.25mm/px · 1 of 10 slices shown (3 of 6)]
[im 1/10]
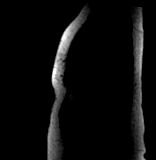

[Series 40: pre short axis · sagittal · non-contrast · 8.0mm · 2.25mm/px · 1 of 10 slices shown (4 of 6)]
[im 1/10]
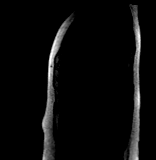

[Series 41: pre short axis · sagittal · non-contrast · 8.0mm · 2.25mm/px · 1 of 10 slices shown (5 of 6)]
[im 1/10]
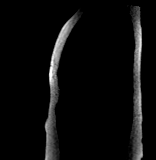

[Series 42: pre short axis · sagittal · non-contrast · 8.0mm · 2.25mm/px · 1 of 10 slices shown (6 of 6)]
[im 1/10]
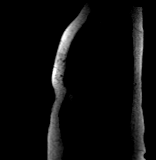

[Series 43: STIR · sagittal · 8.0mm · 1.92mm/px · 1 of 17 slices shown]
[im 1/17]
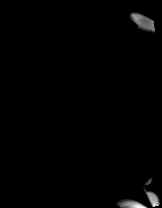

[Series 44: rest short axis · sagittal · 8.0mm · 2.25mm/px · 1 of 60 slices shown (1 of 6)]
[im 1/60]
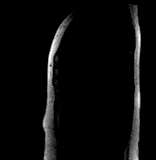

[Series 45: rest short axis · sagittal · 8.0mm · 2.25mm/px · 1 of 60 slices shown (2 of 6)]
[im 1/60]
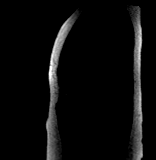

[Series 46: rest short axis · sagittal · 8.0mm · 2.25mm/px · 1 of 60 slices shown (3 of 6)]
[im 1/60]
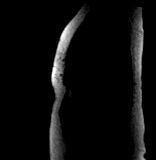

[Series 47: rest short axis · sagittal · 8.0mm · 2.25mm/px · 1 of 60 slices shown (4 of 6)]
[im 1/60]
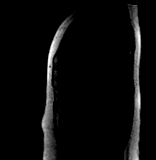

[Series 48: rest short axis · sagittal · 8.0mm · 2.25mm/px · 1 of 60 slices shown (5 of 6)]
[im 1/60]
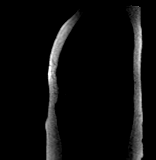

[Series 49: rest short axis · sagittal · 8.0mm · 2.25mm/px · 1 of 60 slices shown (6 of 6)]
[im 1/60]
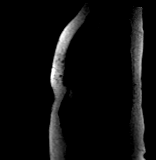

[Series 50: bSSFP · sagittal · 8.0mm · 1.73mm/px · 1 of 17 slices shown (22 of 23)]
[im 1/17]
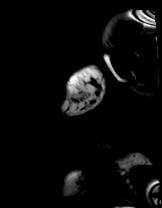

[Series 51: bSSFP · sagittal · 8.0mm · 1.73mm/px · 1 of 17 slices shown (23 of 23)]
[im 1/17]
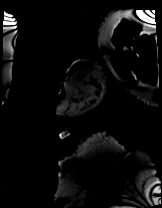

[Series 52: aortic valve cine · axial · 6.0mm · 1.41mm/px · 1 of 25 slices shown]
[im 1/25]
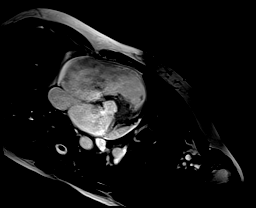

[Series 53: cine rvit · oblique · 6.0mm · 1.41mm/px · 1 of 25 slices shown]
[im 1/25]
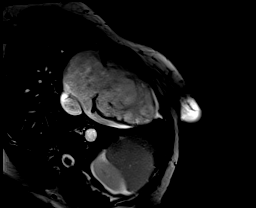

[Series 54: cine rvot · sagittal · 6.0mm · 1.41mm/px · 1 of 25 slices shown]
[im 1/25]
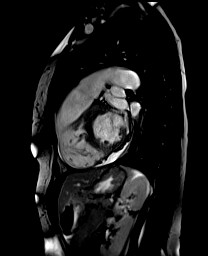

[Series 56: lge_single shot sa · sagittal · 8.0mm · 2.08mm/px · 1 of 17 slices shown]
[im 1/17]
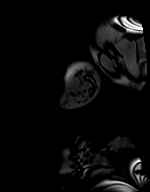

[45 of 48 positions shown; findings below may reference images not displayed]

FINDINGS: Left ventricle:

-Normal size

-Normal systolic function

-Asymmetric hypertrophy measuring up to 14mm in basal anterior wall
(8mm in posterior wall)

-Nonspecific ECV elevation (32%)

-RV insertion site LGE

LV EF:  68% (Normal 56-78%)

Absolute volumes:

LV EDV: 104mL (Normal 52-141 mL)

LV ESV: 33mL (Normal 13-51 mL)

LV SV: 71mL (Normal 33-97 mL)

CO: 4.5L/min (Normal 2.7-6.0 L/min)

Indexed volumes:

LV EDV: 58mL/sq-m (Normal 41-81 mL/sq-m)

LV ESV: 19mL/sq-m (Normal 12-21 mL/sq-m)

LV SV: 39mL/sq-m (Normal 26-56 mL/sq-m)

CI: 2.5L/min/sq-m (Normal 1.8-3.8 L/min/sq-m)

Right ventricle: Severe dilatation.  Normal systolic function

RV EF: 53% (Normal 47-80%)

Absolute volumes:

RV EDV: 284mL (Normal 58-154 mL)

RV ESV: 133mL (Normal 12-68 mL)

RV SV: 151mL (Normal 35-98 mL)

CO: 9.6L/min (Normal 2.7-6 L/min)

Indexed volumes:

RV EDV: 158mL/sq-m (Normal 48-87 mL/sq-m)

RV ESV: 74mL/sq-m (Normal 11-28 mL/sq-m)

RV SV: 84mL/sq-m (Normal 27-57 mL/sq-m)

CI: 5.3L/min/sq-m (Normal 1.8-3.8 L/min/sq-m)

Left atrium: Moderate enlargement

Right atrium: Moderate enlargement

Mitral valve: Mild regurgitation

Aortic valve: No regurgitation

Tricuspid valve: Moderate to severe regurgitation

Pulmonic valve: Mild regurgitation

Aorta: Normal proximal ascending aorta

Pulmonary artery: Dilated main PA measures 32mm

Pericardium: Moderate effusion measuring up to 11mm adjacent to LV
inferior wall
IMPRESSION: 1. No evidence of cardiac amyloidosis, as amyloid late gadolinium
enhancement pattern is not seen and extracellular volume while
elevated (32%) is not in amyloid range

2. Asymmetric LV hypertrophy measuring up to 14mm in basal anterior
wall (8mm in posterior wall), which does not meet criteria for
hypertrophic cardiomyopathy (<15mm)

3.  Normal LV size and systolic function (EF 68%)

4.  Severe RV dilatation with normal systolic function (EF 53%)

5. Moderate to severe tricuspid regurgitation. TR was not directly
quantified, but appears severe based on comparison of RV to LV
stroke volumes

6. Moderate pericardial effusion measuring up to 11mm adjacent to LV
inferior wall

7. RV insertion site LGE, which is a nonspecific finding often seen
in setting of elevated pulmonary pressures
